# Patient Record
Sex: Female | Born: 1975
Health system: Southern US, Community
[De-identification: ages and names within clinical notes are randomized; demographics above are authoritative.]

## PROBLEM LIST (undated history)

## (undated) DIAGNOSIS — F418 Other specified anxiety disorders: Principal | ICD-10-CM

## (undated) DIAGNOSIS — E669 Obesity, unspecified: Secondary | ICD-10-CM

## (undated) DIAGNOSIS — F17210 Nicotine dependence, cigarettes, uncomplicated: Secondary | ICD-10-CM

## (undated) DIAGNOSIS — K644 Residual hemorrhoidal skin tags: Secondary | ICD-10-CM

## (undated) DIAGNOSIS — R87619 Unspecified abnormal cytological findings in specimens from cervix uteri: Secondary | ICD-10-CM

## (undated) HISTORY — DX: Residual hemorrhoidal skin tags: K64.4

## (undated) HISTORY — DX: Nicotine dependence, cigarettes, uncomplicated: F17.210

## (undated) HISTORY — DX: Other specified anxiety disorders: F41.8

## (undated) HISTORY — DX: Unspecified abnormal cytological findings in specimens from cervix uteri: R87.619

## (undated) HISTORY — DX: Obesity, unspecified: E66.9

---

## 2003-07-20 DIAGNOSIS — R87619 Unspecified abnormal cytological findings in specimens from cervix uteri: Secondary | ICD-10-CM

## 2003-07-20 HISTORY — PX: CERVICAL BIOPSY  W/ LOOP ELECTRODE EXCISION: SUR135

## 2003-07-20 HISTORY — DX: Unspecified abnormal cytological findings in specimens from cervix uteri: R87.619

## 2014-12-19 ENCOUNTER — Ambulatory Visit (INDEPENDENT_AMBULATORY_CARE_PROVIDER_SITE_OTHER): Payer: Commercial Managed Care - PPO | Admitting: Obstetrics and Gynecology

## 2014-12-19 ENCOUNTER — Encounter: Payer: Self-pay | Admitting: Obstetrics and Gynecology

## 2014-12-19 VITALS — BP 120/78 | HR 64 | Resp 16 | Ht 63.5 in | Wt 173.0 lb

## 2014-12-19 DIAGNOSIS — Z113 Encounter for screening for infections with a predominantly sexual mode of transmission: Secondary | ICD-10-CM

## 2014-12-19 DIAGNOSIS — Z01419 Encounter for gynecological examination (general) (routine) without abnormal findings: Secondary | ICD-10-CM

## 2014-12-19 DIAGNOSIS — N6452 Nipple discharge: Secondary | ICD-10-CM

## 2014-12-19 NOTE — Progress Notes (Signed)
Patient ID: Lauren Branch, female   DOB: October 18, 1975, 39 y.o.   MRN: [id number] 39 y.o.  G2 on file. Legally Separated Caucasian female here for annual exam.  "Goes by Lauren Branch"  Has a Mirena  IUD.  Placed 09/29/11.  Due for removal on 09/28/16. Cycles are irregular.  Bleeds a total of 3 - 4 days per month. Not able to feel strings.   Has bilateral nipple discharge with expression for a long time.  Describes it as creamy, white and mucousy and not really liquidy. On Citalopram.  Occasional vaginal discharge.  No itching or odor.   Works in Audiological scientist at [employer]. Has 2  girls - 3 and 38 years old.   Smoker - 1/2 pack per day.  Not ready to quit.   Will do blood work at work with the nurse.  PCP:   No PCP  Patient's last menstrual period was 12/09/2014.          Sexually active: Yes.    The current method of family planning is IUD good till 09-28-16.    Exercising: No.   Smoker:  yes  Health Maintenance: Pap:  2014 in [place], [place]  History of abnormal Pap:  Yes 2005 had a  LEEP- everything was neg per patient in [place], [place].  This was for high grade disease. Clinic has now closed where the LEEP was done.  MMG:  Never Colonoscopy: Never BMD:  Never TDaP:  With in 10 yrs per patient Screening Labs:  Employer does fasting labs reports that she has been smoking Cigarettes.  She has a 7.5 pack-year smoking history. She has never used smokeless tobacco. She reports that she drinks about 1.2 oz of alcohol per week. She reports that she does not use illicit drugs.  Past Medical History  Diagnosis Date  . Cervical dysphagia 2005  . Anxiety     Past Surgical History  Procedure Laterality Date  . Cervical biopsy  w/ loop electrode excision      Current Outpatient Prescriptions  Medication Sig Dispense Refill  . citalopram (CELEXA) 40 MG tablet Take 40 mg by mouth daily.  5   No current facility-administered medications for this visit.    Family History  Problem Relation Age of  Onset  . Diabetes Father     ROS:  Pertinent items are noted in HPI.  Otherwise, a comprehensive ROS was negative.  Exam:   BP 120/78 mmHg  Pulse 64  Resp 16  Ht 5' 3.5" (1.613 m)  Wt 173 lb (78.472 kg)  BMI 30.16 kg/m2  LMP 12/09/2014    General appearance: alert, cooperative and appears stated age Head: Normocephalic, without obvious abnormality, atraumatic Neck: no adenopathy, supple, symmetrical, trachea midline and thyroid normal to inspection and palpation Lungs: clear to auscultation bilaterally Breasts: normal appearance, no masses or tenderness, Inspection negative, No nipple retraction or dimpling, No nipple discharge or bleeding, No axillary or supraclavicular adenopathy Heart: regular rate and rhythm Abdomen: soft, non-tender; bowel sounds normal; no masses,  no organomegaly Extremities: extremities normal, atraumatic, no cyanosis or edema Skin: Skin color, texture, turgor normal. No rashes or lesions Lymph nodes: Cervical, supraclavicular, and axillary nodes normal. No abnormal inguinal nodes palpated Neurologic: Grossly normal  Pelvic: External genitalia:  no lesions              Urethra:  normal appearing urethra with no masses, tenderness or lesions              Bartholins and Skenes:  normal                 Vagina: normal appearing vagina with normal color and discharge, no lesions              Cervix: no lesions and IUD strings seen.              Pap taken: Yes.   Bimanual Exam:  Uterus:  normal size, contour, position, consistency, mobility, non-tender              Adnexa: normal adnexa and no mass, fullness, tenderness              Rectovaginal: No..  Confirms.              Anus:  normal sphincter tone, no lesions  Chaperone was present for exam.  Assessment:   Well woman visit with normal exam. Mirena IUD patient.  Smoker.  Declines cessation.  History of LEEP for HGSIL. Bilateral nipple discharge.  Not reproducible on exam today.   Plan: Yearly  mammogram recommended after age 39.  Recommended self breast exam.  Pap and HR HPV as above. Discussed Calcium, Vitamin D, regular exercise program including cardiovascular and weight bearing exercise. Labs performed.  Yes.  .   See orders.  Will do STD testing today.  Check TSH and prolactin. Discussed condom use.  Refills given on medications.  No..   Follow up annually and prn.      After visit summary provided.

## 2014-12-19 NOTE — Patient Instructions (Signed)

## 2014-12-20 LAB — PROLACTIN: Prolactin: 8.5 ng/mL

## 2014-12-20 LAB — STD PANEL
HIV 1&2 Ab, 4th Generation: NONREACTIVE
Hepatitis B Surface Ag: NEGATIVE

## 2014-12-20 LAB — TSH: TSH: 1.779 u[IU]/mL (ref 0.350–4.500)

## 2014-12-20 LAB — HEPATITIS C ANTIBODY: HCV AB: NEGATIVE

## 2014-12-21 LAB — GC/CHLAMYDIA PROBE AMP, URINE
CHLAMYDIA, SWAB/URINE, PCR: NEGATIVE
GC PROBE AMP, URINE: NEGATIVE

## 2014-12-23 LAB — IPS PAP TEST WITH HPV

## 2015-12-24 ENCOUNTER — Encounter: Payer: Self-pay | Admitting: Nurse Practitioner

## 2015-12-24 ENCOUNTER — Ambulatory Visit (INDEPENDENT_AMBULATORY_CARE_PROVIDER_SITE_OTHER): Payer: Commercial Managed Care - PPO | Admitting: Nurse Practitioner

## 2015-12-24 VITALS — BP 120/74 | HR 70 | Resp 16 | Ht 64.25 in | Wt 188.0 lb

## 2015-12-24 DIAGNOSIS — R87613 High grade squamous intraepithelial lesion on cytologic smear of cervix (HGSIL): Secondary | ICD-10-CM | POA: Diagnosis not present

## 2015-12-24 DIAGNOSIS — Z01419 Encounter for gynecological examination (general) (routine) without abnormal findings: Secondary | ICD-10-CM | POA: Diagnosis not present

## 2015-12-24 NOTE — Progress Notes (Signed)
40 y.o. [id number] Legally Separated  Caucasian Fe here for annual exam.  Separated from husband X 2.5 yrs.  divorce is pending.  Not SA/ dating since.  having menses every other mont light for 2-3 days.  Usually 1 day of moderate spotting. Some low back pain, no PMS.  Prior to Mirena IUD was having heavy menses and lasting 5 days, lot of cramps.  Patient's last menstrual period was 12/16/2015.          Sexually active: No.  The current method of family planning is Mirena IUD insertion 09/29/2011 and to be replaced 09/2016 Exercising: Yes.    Walking  3 x weekly Smoker:  yes  Health Maintenance: Pap:  12/19/14 Neg. HR HPV:neg MMG:  Never TDaP:  Current  Hep C and HIV: 12/2014 Neg Labs: at work   reports that she has been smoking Cigarettes.  She has a 7.5 pack-year smoking history. She has never used smokeless tobacco. She reports that she drinks about 1.2 oz of alcohol per week. She reports that she does not use illicit drugs.  Past Medical History  Diagnosis Date  . Anxiety   . Abnormal Pap smear of cervix 2005    cervical dysplasia/LEEP/[place], [place]    Past Surgical History  Procedure Laterality Date  . Cervical biopsy  w/ loop electrode excision  2005    [place]    Current Outpatient Prescriptions  Medication Sig Dispense Refill  . citalopram (CELEXA) 40 MG tablet Take 40 mg by mouth daily.  5  . levonorgestrel (MIRENA) 20 MCG/24HR IUD 1 each by Intrauterine route once. Due for removal 09/28/16.     No current facility-administered medications for this visit.    Family History  Problem Relation Age of Onset  . Diabetes Father 5  . Heart defect Brother     at birth and had open heart surgery < 1 yr.  . Alzheimer's disease Maternal Grandfather     died of surgical complication  . Dementia Maternal Grandmother   . Osteoarthritis Maternal Aunt     ROS:  Pertinent items are noted in HPI.  Otherwise, a comprehensive ROS was negative.  Exam:   BP 120/74 mmHg   Pulse 70  Resp 16  Ht 5' 4.25" (1.632 m)  Wt 188 lb (85.276 kg)  BMI 32.02 kg/m2  LMP 12/16/2015 Height: 5' 4.25" (163.2 cm) Ht Readings from Last 3 Encounters:  12/24/15 5' 4.25" (1.632 m)  12/19/14 5' 3.5" (1.613 m)    General appearance: alert, cooperative and appears stated age Head: Normocephalic, without obvious abnormality, atraumatic Neck: no adenopathy, supple, symmetrical, trachea midline and thyroid normal to inspection and palpation Lungs: clear to auscultation bilaterally Breasts: normal appearance, no masses or tenderness Heart: regular rate and rhythm Abdomen: soft, non-tender; no masses,  no organomegaly Extremities: extremities normal, atraumatic, no cyanosis or edema Skin: Skin color, texture, turgor normal. No rashes or lesions Lymph nodes: Cervical, supraclavicular, and axillary nodes normal. No abnormal inguinal nodes palpated Neurologic: Grossly normal   Pelvic: External genitalia:  no lesions              Urethra:  normal appearing urethra with no masses, tenderness or lesions              Bartholin's and Skene's: normal                 Vagina: normal appearing vagina with normal color and discharge, no lesions  Cervix: anteverted IUD strings are visible              Pap taken: No. Bimanual Exam:  Uterus:  normal size, contour, position, consistency, mobility, non-tender              Adnexa: no mass, fullness, tenderness               Rectovaginal: Confirms               Anus:  normal sphincter tone, no lesions  Chaperone present: no  A:  Well Woman with normal exam  Contraception - Mirena IUD placed 09/29/2011   Smoker. Declines cessation. currently at 1/2 - 3/4 pack a day  History of LEEP for HGSIL. 2005 - normal since    P:   Reviewed health and wellness pertinent to exam  Pap smear as above  Mammogram - may get screening this year as baseline  Counseled on breast self exam, adequate intake of calcium and vitamin D, diet and  exercise return annually or prn  An After Visit Summary was printed and given to the patient.

## 2015-12-24 NOTE — Patient Instructions (Signed)

## 2015-12-27 NOTE — Progress Notes (Signed)
Encounter reviewed Myron Stankovich, MD   

## 2016-10-25 ENCOUNTER — Telehealth: Payer: Self-pay | Admitting: Nurse Practitioner

## 2016-10-25 NOTE — Telephone Encounter (Signed)
Patient calling to have mirena removed and replaced.

## 2016-10-25 NOTE — Telephone Encounter (Signed)
I agree with this plan.  Have her call the office back with the UPT result.  We will do a UPT in the office also on the day of the exchange.  Orders will need to go to precert if not already done.   Thanks.

## 2016-10-25 NOTE — Telephone Encounter (Signed)
Spoke with patient. Patient's Mirena was placed on 09/29/2011 and was due for removal on 3/13/208. Patient is calling to have this removed and replaced. Patient has not been having regular cycles since having IUD. Has recently been sexually active without BUM. Advised she will need to take a UPT as her current IUD has expired. Advised will review with Dr.Silva IUD removal and reinsertion recommendations and return call. Patient is agreeable.  Dr.Silva, okay for patient to take UPT if negative go 2 weeks without intercourse and come in for IUD removal and reinsertion?

## 2016-10-26 NOTE — Telephone Encounter (Signed)
Left message to call Kaitlyn at [phone]. 

## 2016-10-26 NOTE — Telephone Encounter (Signed)
Spoke with patient. Advised of message as seen below from Dr.Silva. Patient is agreeable and verbalizes understanding. Will take UPT and return call with results. If negative can proceed with IUD removal and replacement after 2 weeks of abstinence.  Routing to provider for final review. Patient agreeable to disposition. Will close encounter.

## 2016-11-18 ENCOUNTER — Telehealth: Payer: Self-pay | Admitting: Nurse Practitioner

## 2016-11-18 ENCOUNTER — Other Ambulatory Visit: Payer: Self-pay | Admitting: *Deleted

## 2016-11-18 DIAGNOSIS — Z30433 Encounter for removal and reinsertion of intrauterine contraceptive device: Secondary | ICD-10-CM

## 2016-11-18 NOTE — Telephone Encounter (Signed)
Spoke with patient. Patient would like to scheduled IUD removal and reinsertion at [facility] with Ria CommentPatricia Grubb, NP. Mirena IUD placed 09/29/11. Advised patient they will need to be 2 different appointments and physician would be removing and reinsertion of IUD. Patient reports last having intercourse on 10/30/16, LMP "after that weekend, cycle is light, unsure of exact date". Patient scheduled for IUD removal, reinsertion on 11/22/16 at 3pm with Dr. Edward JollySilva. Advised patient since IUD expired will need to take UPT and report results, abstain from intercourse until after IUD exchange. Advised to take Motrin 800 mg with food and water one hour before procedure. Patient asking what cost will be, advised will have insurance and benefits department return call to review benefits. Advised patient would review with Dr. Edward JollySilva and return call with any additional recommendations, patient verbalizes understanding and is agreeable.   Dr. Edward JollySilva -any additional recommendations?   Cc: Lauren DingwallSuzy Branch, Ria CommentPatricia Grubb, NP

## 2016-11-18 NOTE — Telephone Encounter (Signed)
Patient would like to have her iud removed and inserted at her [facility] appointment. Patient is scheduled with Patty in June but is aware that [facility] does not insert or remove iuds.

## 2016-11-18 NOTE — Telephone Encounter (Signed)
I agree with your recommendations.  I am happy to do the exchange of the IUD.

## 2016-11-22 ENCOUNTER — Ambulatory Visit: Payer: Self-pay | Admitting: Obstetrics and Gynecology

## 2016-11-22 ENCOUNTER — Telehealth: Payer: Self-pay | Admitting: Obstetrics and Gynecology

## 2016-11-22 NOTE — Progress Notes (Deleted)
GYNECOLOGY  VISIT   HPI: 41 y.o.   Legally Separated  Caucasian  female   [phone] with No LMP recorded.   here for Mirena IUD removal and reinsertion.    GYNECOLOGIC HISTORY: No LMP recorded. Contraception:  Mirena IUD inserted 09-19-11--EXPIRED Menopausal hormone therapy:  n/a Last mammogram:  n/a Last pap smear:   12-19-14 Neg:Neg HR HPV; [place]--[place] for high grade disease per patient.        OB History    Gravida Para Term Preterm AB Living   2 2 2  0 0 2   SAB TAB Ectopic Multiple Live Births   0 0 0 0           There are no active problems to display for this patient.   Past Medical History:  Diagnosis Date  . Abnormal Pap smear of cervix 2005   cervical dysplasia/LEEP/[place], [place]  . Anxiety     Past Surgical History:  Procedure Laterality Date  . CERVICAL BIOPSY  W/ LOOP ELECTRODE EXCISION  2005   [place]    Current Outpatient Prescriptions  Medication Sig Dispense Refill  . citalopram (CELEXA) 40 MG tablet Take 40 mg by mouth daily.  5  . levonorgestrel (MIRENA) 20 MCG/24HR IUD 1 each by Intrauterine route once. Due for removal 09/28/16.     No current facility-administered medications for this visit.      ALLERGIES: Patient has no known allergies.  Family History  Problem Relation Age of Onset  . Diabetes Father 5  . Heart defect Brother     at birth and had open heart surgery < 1 yr.  . Alzheimer's disease Maternal Grandfather     died of surgical complication  . Dementia Maternal Grandmother   . Osteoarthritis Maternal Aunt     Social History   Social History  . Marital status: Legally Separated    Spouse name: N/A  . Number of children: N/A  . Years of education: N/A   Occupational History  . Not on file.   Social History Main Topics  . Smoking status: Current Every Day Smoker    Packs/day: 0.50    Years: 15.00    Types: Cigarettes  . Smokeless tobacco: Never Used  . Alcohol  use 1.2 oz/week    2 Standard drinks or equivalent per week  . Drug use: No  . Sexual activity: Yes    Partners: Male    Birth control/ protection: IUD     Comment: Mirena  removal date 09-28-16   Other Topics Concern  . Not on file   Social History Narrative  . No narrative on file    ROS:  Pertinent items are noted in HPI.  PHYSICAL EXAMINATION:    There were no vitals taken for this visit.    General appearance: alert, cooperative and appears stated age Head: Normocephalic, without obvious abnormality, atraumatic Neck: no adenopathy, supple, symmetrical, trachea midline and thyroid normal to inspection and palpation Lungs: clear to auscultation bilaterally Breasts: normal appearance, no masses or tenderness, No nipple retraction or dimpling, No nipple discharge or bleeding, No axillary or supraclavicular adenopathy Heart: regular rate and rhythm Abdomen: soft, non-tender, no masses,  no organomegaly Extremities: extremities normal, atraumatic, no cyanosis or edema Skin: Skin color, texture, turgor normal. No rashes or lesions Lymph nodes: Cervical, supraclavicular, and axillary nodes normal. No abnormal inguinal nodes palpated Neurologic: Grossly normal  Pelvic: External genitalia:  no  lesions              Urethra:  normal appearing urethra with no masses, tenderness or lesions              Bartholins and Skenes: normal                 Vagina: normal appearing vagina with normal color and discharge, no lesions              Cervix: no lesions                Bimanual Exam:  Uterus:  normal size, contour, position, consistency, mobility, non-tender              Adnexa: no mass, fullness, tenderness              Rectal exam: {yes no:[id number]}.  Confirms.              Anus:  normal sphincter tone, no lesions  Chaperone was present for exam.  ASSESSMENT     PLAN     An After Visit Summary was printed and given to the patient.  ______ minutes face to face time of  which over 50% was spent in counseling.

## 2016-11-22 NOTE — Telephone Encounter (Signed)
Spoke with patient. Mirena removal and reinsertion rescheduled for 11/26/2016 at 3 pm with Dr.Silva. Patient is agreeable to date and time. Pre procedure instructions given.  Motrin instructions given. Motrin=Advil=Ibuprofen, 800 mg one hour before appointment. Eat a meal and hydrate well before appointment. Patient has taken UPT which was negative as her IUD has expired. Patient is aware she will need to remain abstinent until exchange of IUD and that UPT will be performed at the office.  Routing to provider for final review. Patient agreeable to disposition. Will close encounter.

## 2016-11-22 NOTE — Telephone Encounter (Signed)
Patient called to cancel appointment for today for IUD removal and reinsertion. Her daughter broke her ankle this weekend and she needs to take her to the orthopedic surgeon today. Patient would like to reschedule for sometime this week if possible.

## 2016-11-25 NOTE — Progress Notes (Signed)
GYNECOLOGY  VISIT   HPI: 41 y.o.   Legally Separated  Caucasian  female   [phone] [id number] with No LMP recorded (lmp unknown).   here for Mirena IUD removal and reinsertion.  Patient took 400mg  of Ibuprofen 1 hour ago.   Abstained from intercourse for one month. Current IUD is expired.   Spotting a little today.   Has a new partner for a couple of months.   UPT negative today.  GYNECOLOGIC HISTORY: No LMP recorded (lmp unknown). Contraception:  Mirena IUD inserted 09-29-11(Expired) Menopausal hormone therapy:  n/a Last mammogram:  n/a Last pap smear: 12-19-14 Neg:Neg HR HPV; [place]        OB History    Gravida Para Term Preterm AB Living   2 2 2  0 0 2   SAB TAB Ectopic Multiple Live Births   0 0 0 0           There are no active problems to display for this patient.   Past Medical History:  Diagnosis Date  . Abnormal Pap smear of cervix 2005   cervical dysplasia/LEEP/[place], [place]  . Anxiety     Past Surgical History:  Procedure Laterality Date  . CERVICAL BIOPSY  W/ LOOP ELECTRODE EXCISION  2005   [place]    Current Outpatient Prescriptions  Medication Sig Dispense Refill  . citalopram (CELEXA) 40 MG tablet Take 40 mg by mouth daily.  5  . doxycycline (VIBRAMYCIN) 100 MG capsule TAKE ONE CAPSULE BY MOUTH TWICE A DAY UNTIL FINISHED  0  . levonorgestrel (MIRENA) 20 MCG/24HR IUD 1 each by Intrauterine route once. Due for removal 09/28/16.    Marland Kitchen. valACYclovir (VALTREX) 1000 MG tablet TAKE 2 TABLETS BY MOUTH TWICE A DAY FOR 1 DAY AT ONSET OF COLD SORE  3   No current facility-administered medications for this visit.      ALLERGIES: Patient has no known allergies.  Family History  Problem Relation Age of Onset  . Diabetes Father 5  . Heart defect Brother        at birth and had open heart surgery < 1 yr.  . Alzheimer's disease Maternal Grandfather        died of surgical complication  . Dementia Maternal Grandmother   . Osteoarthritis  Maternal Aunt     Social History   Social History  . Marital status: Legally Separated    Spouse name: N/A  . Number of children: N/A  . Years of education: N/A   Occupational History  . Not on file.   Social History Main Topics  . Smoking status: Current Every Day Smoker    Packs/day: 0.50    Years: 15.00    Types: Cigarettes  . Smokeless tobacco: Never Used  . Alcohol use 1.2 oz/week    2 Standard drinks or equivalent per week  . Drug use: No  . Sexual activity: Yes    Partners: Male    Birth control/ protection: IUD     Comment: Mirena  removal date 09-28-16   Other Topics Concern  . Not on file   Social History Narrative  . No narrative on file    ROS:  Pertinent items are noted in HPI.  PHYSICAL EXAMINATION:    BP 118/70 (BP Location: Right Arm, Patient Position: Sitting, Cuff Size: Normal)   Pulse 84   Resp 16   Wt 177 lb (80.3 kg)   LMP  (LMP Unknown) Comment: patient has had some  spotting  BMI 30.15 kg/m     General appearance: alert, cooperative and appears stated age   Pelvic: External genitalia:  no lesions              Urethra:  normal appearing urethra with no masses, tenderness or lesions              Bartholins and Skenes: normal                 Vagina: normal appearing vagina with normal color and discharge, no lesions              Cervix: no lesions.  IUD strings noted.                Bimanual Exam:  Uterus:  normal size, contour, position, consistency, mobility, non-tender              Adnexa: no mass, fullness, tenderness        IUD removal and reinsertion of Mirena IUD.  Consent for procedures.  Mirena IUD - Lot number [id number], exp Sept, 2010.  Sterile prep with Hibiclens.  Tenaculum to anterior cervical lip. Uterus sounded to 7 cm.  IUD placed without difficulty.  Strings trimmed.  No complications.  Minimal EBL. Repeat BM exam, no change.  Chaperone was present for exam.  ASSESSMENT  Mirena IUD removal and replacement of  new Mirena IUD.  STD screening.   PLAN  GC/CT performed today.  I did this after I had already done my initial bimanual exam prior to placing the IUD.  I did try to get inside the canal adequately to make up for this. Back up protection for 2 weeks.  Follow up for annual exam and can have IUD check then. Will need full serum STD check then.   An After Visit Summary was printed and given to the patient.  __15___ minutes face to face time of which over 50% was spent in counseling.

## 2016-11-26 ENCOUNTER — Ambulatory Visit (INDEPENDENT_AMBULATORY_CARE_PROVIDER_SITE_OTHER): Payer: Commercial Managed Care - PPO | Admitting: Obstetrics and Gynecology

## 2016-11-26 ENCOUNTER — Encounter: Payer: Self-pay | Admitting: Obstetrics and Gynecology

## 2016-11-26 VITALS — BP 118/70 | HR 84 | Resp 16 | Wt 177.0 lb

## 2016-11-26 DIAGNOSIS — Z30433 Encounter for removal and reinsertion of intrauterine contraceptive device: Secondary | ICD-10-CM | POA: Diagnosis not present

## 2016-11-26 DIAGNOSIS — Z113 Encounter for screening for infections with a predominantly sexual mode of transmission: Secondary | ICD-10-CM

## 2016-11-26 HISTORY — PX: INTRAUTERINE DEVICE INSERTION: SHX323

## 2016-11-26 LAB — POCT URINE PREGNANCY: Preg Test, Ur: NEGATIVE

## 2016-11-26 NOTE — Patient Instructions (Signed)

## 2016-11-27 LAB — GC/CHLAMYDIA PROBE AMP
CT Probe RNA: NOT DETECTED
GC PROBE AMP APTIMA: NOT DETECTED

## 2016-12-29 ENCOUNTER — Other Ambulatory Visit (HOSPITAL_COMMUNITY)
Admission: RE | Admit: 2016-12-29 | Discharge: 2016-12-29 | Disposition: A | Discharge status: Home / Self Care | Payer: Commercial Managed Care - PPO | Source: Ambulatory Visit | Attending: Nurse Practitioner | Admitting: Nurse Practitioner

## 2016-12-29 ENCOUNTER — Encounter: Payer: Self-pay | Admitting: Nurse Practitioner

## 2016-12-29 ENCOUNTER — Ambulatory Visit (INDEPENDENT_AMBULATORY_CARE_PROVIDER_SITE_OTHER): Payer: Commercial Managed Care - PPO | Admitting: Nurse Practitioner

## 2016-12-29 ENCOUNTER — Telehealth: Payer: Self-pay | Admitting: Nurse Practitioner

## 2016-12-29 VITALS — BP 110/74 | HR 60 | Ht 64.0 in | Wt 179.0 lb

## 2016-12-29 DIAGNOSIS — Z113 Encounter for screening for infections with a predominantly sexual mode of transmission: Secondary | ICD-10-CM | POA: Insufficient documentation

## 2016-12-29 DIAGNOSIS — R87613 High grade squamous intraepithelial lesion on cytologic smear of cervix (HGSIL): Secondary | ICD-10-CM | POA: Insufficient documentation

## 2016-12-29 DIAGNOSIS — Z01419 Encounter for gynecological examination (general) (routine) without abnormal findings: Secondary | ICD-10-CM | POA: Diagnosis not present

## 2016-12-29 NOTE — Progress Notes (Signed)
Patient ID: Lauren NettleRebekah Hillyard, female   DOB: [birth year], 41 y.o.   MRN: [id number]  41 y.o. G2P2002 Legally Separated Caucasian Fe here for annual exam.  she hs a new Mirena IUD inserted on 11/26/16.  She also needs that recheck today. Menses is still only spotting with wiping X 2 days.  Prior to previous Mirena IUD menses was very heavy X 5 days with dysmenorrhea. New partner for a few months. [place] in [place] so she has not seen him or been SA since new IUD.   Daughter's are now 2010 & 40 yrs old.  Mother lives with her to help with the kids.  Patient's last menstrual period was 12/22/2016 (exact date).          Sexually active: Yes.    The current method of family planning is IUD. Mirena inserted on 11/26/16. Exercising: Yes.    participates in various outside activities to stay active. Smoker:  Yes, about 1/2 pack per day  Health Maintenance: Pap: 12/19/14, Negative with neg HR HPV  2014, Negative per patient Lissa Hoard([place], [place]) History of Abnormal Pap: yes, LEEP for cervical dysplasia in 2005 Self Breast exams: yes TDaP: within 10 years HIV: 12/19/14 Labs: work, patient brought copies with her   reports that she has been smoking Cigarettes.  She has a 7.50 pack-year smoking history. She has never used smokeless tobacco. She reports that she drinks about 1.2 oz of alcohol per week . She reports that she does not use drugs.  Past Medical History:  Diagnosis Date  . Abnormal Pap smear of cervix 2005   cervical dysplasia/LEEP/[place], [place]  . Anxiety     Past Surgical History:  Procedure Laterality Date  . CERVICAL BIOPSY  W/ LOOP ELECTRODE EXCISION  2005   Cer[place]agia [place]  . INTRAUTERINE DEVICE INSERTION  11/26/2016   Mirena 11/2011 and 11/2016    Current Outpatient Prescriptions  Medication Sig Dispense Refill  . Cholecalciferol (VITAMIN D3) 2000 units TABS Take 1-2 tablets by mouth daily.    . citalopram (CELEXA) 40 MG tablet Take 40 mg by mouth daily.  5  . levonorgestrel (MIRENA)  20 MCG/24HR IUD 1 each by Intrauterine route once. Due for removal 09/28/16.    Marland Kitchen. valACYclovir (VALTREX) 1000 MG tablet TAKE 2 TABLETS BY MOUTH TWICE A DAY FOR 1 DAY AT ONSET OF COLD SORE  3   No current facility-administered medications for this visit.     Family History  Problem Relation Age of Onset  . Diabetes Father 5  . Heart defect Brother        at birth and had open heart surgery < 1 yr.  . Alzheimer's disease Maternal Grandfather        died of surgical complication  . Dementia Maternal Grandmother   . Osteoarthritis Maternal Aunt     ROS:  Pertinent items are noted in HPI.  Otherwise, a comprehensive ROS was negative.  Exam:   BP 110/74 (BP Location: Right Arm, Patient Position: Sitting, Cuff Size: Large)   Pulse 60   Ht 5\' 4"  (1.626 m)   Wt 179 lb (81.2 kg)   LMP 12/22/2016 (Exact Date) Comment: light spotting with IUD  BMI 30.73 kg/m  Height: 5\' 4"  (162.6 cm) Ht Readings from Last 3 Encounters:  12/29/16 5\' 4"  (1.626 m)  12/24/15 5' 4.25" (1.632 m)  12/19/14 5' 3.5" (1.613 m)    General appearance: alert, cooperative and appears stated age Head: Normocephalic, without obvious abnormality, atraumatic Neck:  no adenopathy, supple, symmetrical, trachea midline and thyroid normal to inspection and palpation Lungs: clear to auscultation bilaterally Breasts: normal appearance, no masses or tenderness Heart: regular rate and rhythm Abdomen: soft, non-tender; no masses,  no organomegaly Extremities: extremities normal, atraumatic, no cyanosis or edema Skin: Skin color, texture, turgor normal. No rashes or lesions Lymph nodes: Cervical, supraclavicular, and axillary nodes normal. No abnormal inguinal nodes palpated Neurologic: Grossly normal   Pelvic: External genitalia:  no lesions              Urethra:  normal appearing urethra with no masses, tenderness or lesions              Bartholin's and Skene's: normal                 Vagina: normal appearing vagina with  normal color and discharge, no lesions              Cervix: anteverted  IUD string is visualized              Pap taken: Yes.   Bimanual Exam:  Uterus:  normal size, contour, position, consistency, mobility, non-tender              Adnexa: no mass, fullness, tenderness               Rectovaginal: Confirms               Anus:  normal sphincter tone, no lesions  Chaperone present: yes  A:  Well Woman with normal exam  Contraception with Mirena IUD - new insertion 11/26/16  History of  HGSIL - 2005 with LEEP - normal since  Recent negative GC & Chl - will get rest of STD's today  P:   Reviewed health and wellness pertinent to exam  Pap smear: yes  Mammogram is due at age 54  Pt did not go to the lab - but she is called and will return for STD lab - order is placed for future labs  Counseled on breast self exam, mammography screening, adequate intake of calcium and vitamin D, diet and exercise return annually or prn  An After Visit Summary was printed and given to the patient.

## 2016-12-29 NOTE — Patient Instructions (Signed)

## 2016-12-29 NOTE — Telephone Encounter (Signed)
Pt was called back and confirmed that she did not get her labs done today.  An order is placed for future STD labs and she will call tomorrow to schedule.

## 2016-12-31 NOTE — Progress Notes (Signed)
Encounter reviewed Jill Jertson, MD   

## 2017-01-04 LAB — CYTOLOGY - PAP
DIAGNOSIS: NEGATIVE
HPV (WINDOPATH): DETECTED — AB
HPV 16/18/45 GENOTYPING: NEGATIVE

## 2017-01-20 ENCOUNTER — Telehealth: Payer: Self-pay

## 2017-01-20 NOTE — Telephone Encounter (Signed)
-----   Message from Luisa DagoStephanie C Phillips, [place] sent at 01/11/2017  4:24 PM EDT -----   ----- Message ----- From: Ria CommentGrubb, Patricia, FNP Sent: 01/11/2017   1:07 PM To: Luisa DagoStephanie C Phillips, CMA  Pap 08.  Pap was negative but + HR HPV and negative for 16, 18, & 45.  Per guidelines repeat pap in 1 yrs.  Let pt know about results.

## 2017-01-20 NOTE — Telephone Encounter (Signed)
Left message to call Lauren Branch at [phone]. 

## 2017-01-26 NOTE — Telephone Encounter (Addendum)
Attempted to reach patient at [phone], there was no answer and recording states voicemail box is full.

## 2017-02-08 NOTE — Telephone Encounter (Signed)
Spoke with patient. Results given. 08 recall placed.

## 2017-02-08 NOTE — Telephone Encounter (Signed)
Left message to call Jaiyden Laur at [phone]. 

## 2017-02-22 ENCOUNTER — Telehealth: Payer: Self-pay | Admitting: *Deleted

## 2017-02-22 NOTE — Telephone Encounter (Signed)
Message left on voicemail to follow up on requested labs at recent annual exam. Requested return call from patient.

## 2017-02-22 NOTE — Telephone Encounter (Signed)
-----   Message from Verner Choleborah S Leonard, CNM sent at 02/07/2017  9:36 AM EDT ----- Regarding: Serum STD screening Remind patient has not been done, if she does not desire, we need to cancel order

## 2017-03-18 ENCOUNTER — Ambulatory Visit (INDEPENDENT_AMBULATORY_CARE_PROVIDER_SITE_OTHER): Payer: Commercial Managed Care - PPO | Admitting: Family Medicine

## 2017-03-18 ENCOUNTER — Encounter: Payer: Self-pay | Admitting: Family Medicine

## 2017-03-18 VITALS — BP 122/78 | HR 68 | Temp 97.9°F | Resp 16 | Ht 64.0 in | Wt 177.4 lb

## 2017-03-18 DIAGNOSIS — K644 Residual hemorrhoidal skin tags: Secondary | ICD-10-CM | POA: Diagnosis not present

## 2017-03-18 MED ORDER — HYDROCORTISONE 2.5 % RE CREA: 1.0000 "application " | TOPICAL_CREAM | Freq: Two times a day (BID) | RECTAL | 0 refills | Status: DC

## 2017-03-18 NOTE — Progress Notes (Signed)
Subjective:    Patient ID: Lauren Branch, female    DOB: 1975/08/01, 41 y.o.   MRN: [id number]  HPI This is a 41 yo female who presents today with complaint of hemorrhoids. Started 41 yo when she was pregnant with her oldest child. Had rare flare ups. Over last 3 weeks, she has been having more rectal pain. Is able to feel protrusion. Some improvement with ibuprofen, 600 mg occasionally. Bowel movements have been loose. She has been under a lot of stress. Has daily bowel movements, no straining. Once had blood in toilet bowl. Occasional bleeding with wiping, none recently. No dizziness, no fatigue, no weight loss. Has used some over the counter cream.   She plans to establish care with Dr. Earlene PlaterWallace at a later time. Sees gyn regularly.   Has had increased stress with recent promotion at work. Takes alprazolam rarely, takes citalopram with good results. She is tearful today and states that she is just very tired from a long work week. States that she feels that anxiety is generally well controlled.   Past Medical History:  Diagnosis Date  . Abnormal Pap smear of cervix 2005   cervical dysplasia/LEEP/[place], [place]  . Anxiety    Past Surgical History:  Procedure Laterality Date  . CERVICAL BIOPSY  W/ LOOP ELECTRODE EXCISION  2005   [place]ag[place]  . INTRAUTERINE DEVICE INSERTION  11/26/2016   Mirena 11/2011 and 11/2016   Family History  Problem Relation Age of Onset  . Diabetes Father 5  . Heart defect Brother        at birth and had open heart surgery < 1 yr.  . Alzheimer's disease Maternal Grandfather        died of surgical complication  . Dementia Maternal Grandmother   . Osteoarthritis Maternal Aunt    Social History  Substance Use Topics  . Smoking status: Current Every Day Smoker    Packs/day: 0.50    Years: 15.00    Types: Cigarettes  . Smokeless tobacco: Never Used  . Alcohol use 1.2 oz/week    2 Standard drinks or equivalent per week     Comment: socially        Review of Systems Per HPI    Objective:   Physical Exam  Constitutional: She is oriented to person, place, and time. She appears well-developed and well-nourished. No distress.  Cardiovascular: Normal rate.   Pulmonary/Chest: Effort normal.  Genitourinary:  Genitourinary Comments: Rectum with multiple, soft, non erythematous hemorrhoidal skin tags.   Neurological: She is alert and oriented to person, place, and time.  Skin: Skin is warm and dry. She is not diaphoretic.  Psychiatric: Her behavior is normal. Judgment and thought content normal.  Occasionally tearful.   Vitals reviewed.   BP 122/78 (BP Location: Left Arm, Patient Position: Sitting, Cuff Size: Normal)   Pulse 68   Temp 97.9 F (36.6 C) (Oral)   Resp 16   Ht 5\' 4"  (1.626 m)   Wt 177 lb 6.4 oz (80.5 kg)   SpO2 98%   BMI 30.45 kg/m  Wt Readings from Last 3 Encounters:  03/18/17 177 lb 6.4 oz (80.5 kg)  12/29/16 179 lb (81.2 kg)  11/26/16 177 lb (80.3 kg)       Assessment & Plan:  1. External hemorrhoid - Provided written and verbal information regarding diagnosis and treatment. - hydrocortisone (ANUSOL-HC) 2.5 % rectal cream; Place 1 application rectally 2 (two) times daily.  Dispense: 30 g; Refill: 0 -  encouraged her to have good fluid intake, good fiber intake, discussed avoiding excessive wiping.   - follow up to establish with Dr. Olena Mater, FNP-BC  [facility], [facility]  03/18/2017 1:38 PM

## 2017-03-18 NOTE — Patient Instructions (Addendum)
Please schedule an appointment to establish care with Dr. Earlene PlaterWallace Try using a squeeze bottle with warm water and flushable wipes for cleaning Increase fiber to help with stool texture If not better in 5-7 days, please let us know  Hemorrhoids Hemorrhoids are swollen veins in and around the rectum or anus. There are two types of hemorrhoids:  Internal hemorrhoids. These occur in the veins that are just inside the rectum. They may poke through to the outside and become irritated and painful.  External hemorrhoids. These occur in the veins that are outside of the anus and can be felt as a painful swelling or hard lump near the anus.  Most hemorrhoids do not cause serious problems, and they can be managed with home treatments such as diet and lifestyle changes. If home treatments do not help your symptoms, procedures can be done to shrink or remove the hemorrhoids. What are the causes? This condition is caused by increased pressure in the anal area. This pressure may result from various things, including:  Constipation.  Straining to have a bowel movement.  Diarrhea.  Pregnancy.  Obesity.  Sitting for long periods of time.  Heavy lifting or other activity that causes you to strain.  Anal sex.  What are the signs or symptoms? Symptoms of this condition include:  Pain.  Anal itching or irritation.  Rectal bleeding.  Leakage of stool (feces).  Anal swelling.  One or more lumps around the anus.  How is this diagnosed? This condition can often be diagnosed through a visual exam. Other exams or tests may also be done, such as:  Examination of the rectal area with a gloved hand (digital rectal exam).  Examination of the anal canal using a small tube (anoscope).  A blood test, if you have lost a significant amount of blood.  A test to look inside the colon (sigmoidoscopy or colonoscopy).  How is this treated? This condition can usually be treated at home. However,  various procedures may be done if dietary changes, lifestyle changes, and other home treatments do not help your symptoms. These procedures can help make the hemorrhoids smaller or remove them completely. Some of these procedures involve surgery, and others do not. Common procedures include:  Rubber band ligation. Rubber bands are placed at the base of the hemorrhoids to cut off the blood supply to them.  Sclerotherapy. Medicine is injected into the hemorrhoids to shrink them.  Infrared coagulation. A type of light energy is used to get rid of the hemorrhoids.  Hemorrhoidectomy surgery. The hemorrhoids are surgically removed, and the veins that supply them are tied off.  Stapled hemorrhoidopexy surgery. A circular stapling device is used to remove the hemorrhoids and use staples to cut off the blood supply to them.  Follow these instructions at home: Eating and drinking  Eat foods that have a lot of fiber in them, such as whole grains, beans, nuts, fruits, and vegetables. Ask your health care provider about taking products that have added fiber (fiber supplements).  Drink enough fluid to keep your urine clear or pale yellow. Managing pain and swelling  Take warm sitz baths for 20 minutes, 3-4 times a day to ease pain and discomfort.  If directed, apply ice to the affected area. Using ice packs between sitz baths may be helpful. ? Put ice in a plastic bag. ? Place a towel between your skin and the bag. ? Leave the ice on for 20 minutes, 2-3 times a day. General instructions  Take  over-the-counter and prescription medicines only as told by your health care provider.  Use medicated creams or suppositories as told.  Exercise regularly.  Go to the bathroom when you have the urge to have a bowel movement. Do not wait.  Avoid straining to have bowel movements.  Keep the anal area dry and clean. Use wet toilet paper or moist towelettes after a bowel movement.  Do not sit on the toilet  for long periods of time. This increases blood pooling and pain. Contact a health care provider if:  You have increasing pain and swelling that are not controlled by treatment or medicine.  You have uncontrolled bleeding.  You have difficulty having a bowel movement, or you are unable to have a bowel movement.  You have pain or inflammation outside the area of the hemorrhoids. This information is not intended to replace advice given to you by your health care provider. Make sure you discuss any questions you have with your health care provider. Document Released: 07/02/2000 Document Revised: 12/03/2015 Document Reviewed: 03/19/2015 Elsevier Interactive Patient Education  2017 [employer].

## 2017-03-25 NOTE — Telephone Encounter (Signed)
OK to close

## 2017-03-25 NOTE — Telephone Encounter (Signed)
No Patient response. Ok to close encounter?    

## 2017-04-05 ENCOUNTER — Telehealth: Payer: Self-pay | Admitting: Family Medicine

## 2017-04-05 NOTE — Telephone Encounter (Signed)
ROI faxed to [facility]

## 2017-04-07 ENCOUNTER — Encounter: Payer: Self-pay | Admitting: Family Medicine

## 2017-04-07 ENCOUNTER — Ambulatory Visit (INDEPENDENT_AMBULATORY_CARE_PROVIDER_SITE_OTHER): Payer: Commercial Managed Care - PPO | Admitting: Family Medicine

## 2017-04-07 VITALS — BP 124/72 | HR 78 | Temp 98.4°F | Ht 64.0 in | Wt 176.2 lb

## 2017-04-07 DIAGNOSIS — F17210 Nicotine dependence, cigarettes, uncomplicated: Secondary | ICD-10-CM | POA: Diagnosis not present

## 2017-04-07 DIAGNOSIS — E669 Obesity, unspecified: Secondary | ICD-10-CM

## 2017-04-07 DIAGNOSIS — Z975 Presence of (intrauterine) contraceptive device: Secondary | ICD-10-CM | POA: Insufficient documentation

## 2017-04-07 DIAGNOSIS — F418 Other specified anxiety disorders: Secondary | ICD-10-CM

## 2017-04-07 DIAGNOSIS — K644 Residual hemorrhoidal skin tags: Secondary | ICD-10-CM

## 2017-04-07 DIAGNOSIS — F411 Generalized anxiety disorder: Secondary | ICD-10-CM | POA: Insufficient documentation

## 2017-04-07 HISTORY — DX: Residual hemorrhoidal skin tags: K64.4

## 2017-04-07 HISTORY — DX: Obesity, unspecified: E66.9

## 2017-04-07 HISTORY — DX: Other specified anxiety disorders: F41.8

## 2017-04-07 HISTORY — DX: Nicotine dependence, cigarettes, uncomplicated: F17.210

## 2017-04-07 MED ORDER — CLONAZEPAM 0.5 MG PO TABS: 0.5000 mg | ORAL_TABLET | Freq: Two times a day (BID) | ORAL | 1 refills | Status: DC | PRN
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
## 2017-05-04 DIAGNOSIS — K644 Residual hemorrhoidal skin tags: Secondary | ICD-10-CM | POA: Diagnosis not present

## 2017-05-04 DIAGNOSIS — K6289 Other specified diseases of anus and rectum: Secondary | ICD-10-CM | POA: Diagnosis not present

## 2017-06-13 NOTE — Telephone Encounter (Signed)
[facility]

## 2017-07-07 ENCOUNTER — Ambulatory Visit: Payer: Commercial Managed Care - PPO | Admitting: Family Medicine

## 2017-07-15 ENCOUNTER — Ambulatory Visit: Payer: Commercial Managed Care - PPO | Admitting: Family Medicine

## 2017-07-15 VITALS — BP 126/84 | HR 75 | Temp 98.0°F | Ht 64.0 in | Wt 178.0 lb

## 2017-07-15 DIAGNOSIS — F17210 Nicotine dependence, cigarettes, uncomplicated: Secondary | ICD-10-CM | POA: Diagnosis not present

## 2017-07-15 DIAGNOSIS — K644 Residual hemorrhoidal skin tags: Secondary | ICD-10-CM

## 2017-07-15 DIAGNOSIS — E669 Obesity, unspecified: Secondary | ICD-10-CM

## 2017-07-15 DIAGNOSIS — F418 Other specified anxiety disorders: Secondary | ICD-10-CM

## 2017-07-15 MED ORDER — CLONAZEPAM 0.5 MG PO TABS: 0.5000 mg | ORAL_TABLET | Freq: Two times a day (BID) | ORAL | 1 refills | Status: DC | PRN
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
## 2017-07-15 NOTE — Patient Instructions (Signed)
   [place] QUITLINE: 1-800-QUIT-NOW (1-800-784-8669)  

## 2017-12-30 ENCOUNTER — Ambulatory Visit: Payer: Commercial Managed Care - PPO | Admitting: Nurse Practitioner

## 2018-01-02 ENCOUNTER — Other Ambulatory Visit: Payer: Self-pay

## 2018-01-02 ENCOUNTER — Encounter: Payer: Self-pay | Admitting: Obstetrics and Gynecology

## 2018-01-02 ENCOUNTER — Other Ambulatory Visit (HOSPITAL_COMMUNITY)
Admission: RE | Admit: 2018-01-02 | Discharge: 2018-01-02 | Disposition: A | Discharge status: Home / Self Care | Payer: Commercial Managed Care - PPO | Source: Ambulatory Visit | Attending: Obstetrics and Gynecology | Admitting: Obstetrics and Gynecology

## 2018-01-02 ENCOUNTER — Ambulatory Visit (INDEPENDENT_AMBULATORY_CARE_PROVIDER_SITE_OTHER): Payer: Commercial Managed Care - PPO | Admitting: Obstetrics and Gynecology

## 2018-01-02 VITALS — BP 110/74 | HR 76 | Resp 16 | Ht 63.5 in | Wt 180.0 lb

## 2018-01-02 DIAGNOSIS — Z01419 Encounter for gynecological examination (general) (routine) without abnormal findings: Secondary | ICD-10-CM | POA: Insufficient documentation

## 2018-01-02 NOTE — Progress Notes (Signed)
42 y.o. [id number] Legally Separated Caucasian female here for annual exam.    Barely has her menses monthly.  Lasts a day.   Has hemorrhoids.  Saw her PCP and saw a Development worker, international aidgeneral surgeon.  Symptoms improved.   New work and feeling much better.   Does labs at work.  Everything normal per patient.   Declines STD testing.  No change in partner.   PCP: Helane RimaErica Wallace, DO    Patient's last menstrual period was 12/05/2017.           Sexually active: Yes.    The current method of family planning is Mirena inserted on 11/26/16.    Exercising: No.  The patient does not participate in regular exercise at present. Smoker:  yes  Health Maintenance: Pap:  12/29/16 Neg:Pos HR HPV; Neg 45,40,9816,18,45 History of abnormal Pap:  Yes, LEEP for cervical dysplasia in 2005 MMG:  never TDaP:  Up to date per patient Gardasil:   no HIV and Hep C: 12/19/14 Negative Screening Labs:  Done at work   reports that she has been smoking cigarettes.  She has a 7.50 pack-year smoking history. She has never used smokeless tobacco. She reports that she drinks about 1.2 oz of alcohol per week. She reports that she does not use drugs.  Past Medical History:  Diagnosis Date  . Abnormal Pap smear of cervix 2005   [place]  . Cigarette nicotine dependence without complication 04/07/2017  . Obesity (BMI 30-39.9) 04/07/2017  . Residual hemorrhoidal skin tags 04/07/2017  . Situational anxiety 04/07/2017    Past Surgical History:  Procedure Laterality Date  . CERVICAL BIOPSY  W/ LOOP ELECTRODE EXCISION  2005   [place]  . INTRAUTERINE DEVICE INSERTION  11/26/2016   Mirena 11/2011 and 11/2016    Current Outpatient Medications  Medication Sig Dispense Refill  . clonazePAM (KLONOPIN) 0.5 MG tablet Take 1 tablet (0.5 mg total) by mouth 2 (two) times daily as needed for anxiety. 90 tablet 1  . hydrocortisone (ANUSOL-HC) 2.5 % rectal cream Place 1 application rectally 2 (two) times  daily. 30 g 0  . levonorgestrel (MIRENA) 20 MCG/24HR IUD 1 each by Intrauterine route once. Due for removal 09/28/16.    Marland Kitchen. valACYclovir (VALTREX) 1000 MG tablet TAKE 2 TABLETS BY MOUTH TWICE A DAY FOR 1 DAY AT ONSET OF COLD SORE  3   No current facility-administered medications for this visit.     Family History  Problem Relation Age of Onset  . Diabetes type I Father   . Congenital heart disease Brother   . Alzheimer's disease Maternal Grandfather   . Dementia Maternal Grandmother   . Osteoarthritis Maternal Aunt     Review of Systems  Constitutional: Negative.   HENT: Negative.   Eyes: Negative.   Respiratory: Negative.   Cardiovascular: Negative.   Gastrointestinal: Negative.   Endocrine: Negative.   Genitourinary: Negative.   Musculoskeletal: Negative.   Skin: Negative.   Allergic/Immunologic: Negative.   Neurological: Negative.   Hematological: Negative.   Psychiatric/Behavioral: Negative.     Exam:   BP 110/74 (BP Location: Right Arm, Patient Position: Sitting, Cuff Size: Normal)   Pulse 76   Resp 16   Ht 5' 3.5" (1.613 m)   Wt 180 lb (81.6 kg)   LMP 12/05/2017   BMI 31.39 kg/m     General appearance: alert, cooperative and appears stated age Head: Normocephalic, without obvious abnormality, atraumatic Neck: no adenopathy, supple, symmetrical,  trachea midline and thyroid normal to inspection and palpation Lungs: clear to auscultation bilaterally Breasts: normal appearance, no masses or tenderness, No nipple retraction or dimpling, No nipple discharge or bleeding, No axillary or supraclavicular adenopathy Heart: regular rate and rhythm Abdomen: soft, non-tender; no masses, no organomegaly Extremities: extremities normal, atraumatic, no cyanosis or edema Skin: Skin color, texture, turgor normal. No rashes or lesions Lymph nodes: Cervical, supraclavicular, and axillary nodes normal. No abnormal inguinal nodes palpated Neurologic: Grossly normal  Pelvic:  External genitalia:  no lesions              Urethra:  normal appearing urethra with no masses, tenderness or lesions              Bartholins and Skenes: normal                 Vagina: normal appearing vagina with normal color and discharge, no lesions              Cervix: no lesions.  IUD strings noted.              Pap taken: Yes.   Bimanual Exam:  Uterus:  normal size, contour, position, consistency, mobility, non-tender              Adnexa: no mass, fullness, tenderness              Rectal exam: Yes.  .  Confirms.              Anus:  normal sphincter tone, hemorrhoids noted.   Chaperone was present for exam.  Assessment:   Well woman visit with normal exam. Hx LEEP.  Hx positive HR HPV, negative 16/18/45. Mirena IUD. Smoker.   Plan: Mammogram screening.  She will schedule.  Recommended self breast awareness. Pap and HR HPV as above.  Colpo if pap abnormal or has positive HR HPV.  Guidelines for Calcium, Vitamin D, regular exercise program including cardiovascular and weight bearing exercise. Discussed smoking cessation and benefits for cardiovascular health, reducing cancer risk, and reducing risk of osteoporosis.  Follow up annually and prn.   After visit summary provided.

## 2018-01-02 NOTE — Patient Instructions (Signed)

## 2018-01-05 LAB — CYTOLOGY - PAP
DIAGNOSIS: NEGATIVE
HPV (WINDOPATH): DETECTED — AB

## 2018-01-06 ENCOUNTER — Telehealth: Payer: Self-pay | Admitting: *Deleted

## 2018-01-06 DIAGNOSIS — R87810 Cervical high risk human papillomavirus (HPV) DNA test positive: Secondary | ICD-10-CM

## 2018-01-06 NOTE — Telephone Encounter (Signed)
-----   Message from Patton SallesBrook E Amundson C Silva, MD sent at 01/06/2018  1:13 AM EDT ----- Please contact with results showing normal pap and positive HR HPV.  I am recommending colposcopy with me.  Her pap last year was the same and with negative subtyping 16/18/45.  Patient knows to potentially expect the colposcopy..Lauren Branch

## 2018-01-06 NOTE — Telephone Encounter (Signed)
Call to patient. Voice mail has name confirmation. Left message to call back and ask for triage nurse.

## 2018-01-06 NOTE — Telephone Encounter (Signed)
Spoke with patient, advised as seen below per Dr. Edward JollySilva. IUD for contraceptive. LMP 12/05/17. Patient familiar with colpo, questions answered. Advised to take Motrin 800 mg with food and water one hour before procedure.  Colpo scheduled for 01/23/18 at 2:30pm with Dr. Edward JollySilva.   Order placed for colpo, patient aware she will be called to review benefits prior to procedure.   Routing to provider for final review. Patient is agreeable to disposition. Will close encounter.   Cc: Soundra Pilonosa Davis, [address] [employer] [address]

## 2018-01-09 ENCOUNTER — Telehealth: Payer: Self-pay | Admitting: Obstetrics and Gynecology

## 2018-01-09 NOTE — Telephone Encounter (Signed)
Call placed to convey benefits. 

## 2018-01-12 NOTE — Progress Notes (Signed)
Lauren Branch is a 42 y.o. female is here for follow up.  History of Present Illness:   Lauren Branch, CMA acting as scribe for Dr. Helane RimaErica Roberto Hlavaty.   HPI: Patient is in for follow up on anxiety. She states she is doing much better. Taking medications as prescribed and has made some changes in her work. Took a new positon that has improved her anxiety a lot.   Health Maintenance Due  Topic Date Due  . TETANUS/TDAP  07/15/1996   Dep[facility] screen [facility] 07/15/2017 04/07/2017  Decreased Interest 0 0  Down, Depressed, Hopeless 0 0  PHQ - 2 Score 0 0  Altered sleeping 1 -  Tired, decreased energy 1 -  Change in appetite 0 -  Feeling bad or failure about yourself  1 -  Trouble concentrating 0 -  Moving slowly or fidgety/restless 0 -  Suicidal thoughts 0 -  PHQ-9 Score 3 -  Difficult doing work/chores Not difficult at all -   PMHx, SurgHx, SocialHx, FamHx, Medications, and Allergies were reviewed in the Visit Navigator and updated as appropriate.   Patient Active Problem List   Diagnosis Date Noted  . History of abnormal cervical Pap smear, followed by GYN 01/13/2018  . GAD (generalized anxiety disorder) 04/07/2017  . Residual hemorrhoidal skin tags 04/07/2017  . Obesity (BMI 30-39.9) 04/07/2017  . Cigarette nicotine dependence, precontemplative 04/07/2017  . IUD (intrauterine device) in place 04/07/2017   Social History   Tobacco Use  . Smoking status: Current Every Day Smoker    Packs/day: 0.50    Years: 15.00    Pack years: 7.50    Types: Cigarettes  . Smokeless tobacco: Never Used  Substance Use Topics  . Alcohol use: Yes    Alcohol/week: 1.2 oz    Types: 2 Standard drinks or equivalent per week    Comment: socially  . Drug use: No   Current Medications and Allergies:   .  clonazePAM (KLONOPIN) 0.5 MG tablet, Take 1 tablet (0.5 mg total) by mouth 2 (two) times daily as needed for anxiety., Disp: 90 tablet, Rfl: 1 .  hydrocortisone (ANUSOL-HC) 2.5 % rectal  cream, Place 1 application rectally 2 (two) times daily., Disp: 30 g, Rfl: 0 .  levonorgestrel (MIRENA) 20 MCG/24HR IUD, 1 each by Intrauterine route once. Due for removal 09/28/16., Disp: , Rfl:  .  valACYclovir (VALTREX) 1000 MG tablet, TAKE 2 TABLETS BY MOUTH TWICE A DAY FOR 1 DAY AT ONSET OF COLD SORE, Disp: , Rfl: 3  No Known Allergies   Review of Systems   Pertinent items are noted in the HPI. Otherwise, ROS is negative.  Vitals:   Vitals:   01/13/18 1308  BP: 118/72  Pulse: 95  Temp: 98.6 F (37 C)  TempSrc: Oral  SpO2: 100%  Weight: 178 lb 12.8 oz (81.1 kg)  Height: 5' 3.5" (1.613 m)     Body mass index is 31.18 kg/m.  Physical Exam:   Physical Exam  Constitutional: She is oriented to person, place, and time. She appears well-developed and well-nourished. No distress.  HENT:  Head: Normocephalic and atraumatic.  Right Ear: External ear normal.  Left Ear: External ear normal.  Nose: Nose normal.  Mouth/Throat: Oropharynx is clear and moist.  Eyes: Pupils are equal, round, and reactive to light. Conjunctivae and EOM are normal.  Neck: Normal range of motion. Neck supple. No thyromegaly present.  Cardiovascular: Normal rate, regular rhythm, normal heart sounds and intact distal pulses.  Pulmonary/Chest: Effort  normal and breath sounds normal.  Abdominal: Soft. Bowel sounds are normal.  Musculoskeletal: Normal range of motion.  Lymphadenopathy:    She has no cervical adenopathy.  Neurological: She is alert and oriented to person, place, and time.  Skin: Skin is warm and dry. Capillary refill takes less than 2 seconds.  Psychiatric: She has a normal mood and affect. Her behavior is normal.  Nursing note and vitals reviewed.  Assessment and Plan:   [name] was seen today for follow-up.  Diagnoses and all orders for this visit:  Need for Tdap vaccination -     Tdap vaccine greater than or equal to 7yo IM  Situational anxiety Comments: Okay refill  medications. Taking 1/2 tab q am. Orders: -     clonazePAM (KLONOPIN) 0.5 MG tablet; Take 1 tablet (0.5 mg total) by mouth 2 (two) times daily as needed for anxiety.  History of abnormal cervical Pap smear, followed by GYN   . Reviewed expectations re: course of current medical issues. . Discussed self-management of symptoms. . Outlined signs and symptoms indicating need for more acute intervention. . Patient verbalized understanding and all questions were answered. Lauren Branch Health Maintenance issues including appropriate healthy diet, exercise, and smoking avoidance were discussed with patient. . See orders for this visit as documented in the electronic medical record. . Patient received an After Visit Summary.  Lauren Rima, DO [place] [place] 01/13/2018  Future Appointments  Date Time Provider Department Center  01/23/2018  2:30 PM Patton Salles, MD [facility] None  07/17/2018  3:40 PM Lauren Rima, DO [facility]  01/05/2019  2:00 PM Amundson Shirley Friar, MD [facility] None    CMA served as scribe during this visit. History, Physical, and Plan performed by medical provider. The above documentation has been reviewed and is accurate and complete. Lauren Branch, D.O.

## 2018-01-13 ENCOUNTER — Ambulatory Visit: Payer: Commercial Managed Care - PPO | Admitting: Family Medicine

## 2018-01-13 ENCOUNTER — Encounter: Payer: Self-pay | Admitting: Family Medicine

## 2018-01-13 VITALS — BP 118/72 | HR 95 | Temp 98.6°F | Ht 63.5 in | Wt 178.8 lb

## 2018-01-13 DIAGNOSIS — Z23 Encounter for immunization: Secondary | ICD-10-CM | POA: Diagnosis not present

## 2018-01-13 DIAGNOSIS — F418 Other specified anxiety disorders: Secondary | ICD-10-CM

## 2018-01-13 DIAGNOSIS — Z8742 Personal history of other diseases of the female genital tract: Secondary | ICD-10-CM

## 2018-01-13 DIAGNOSIS — Z87898 Personal history of other specified conditions: Secondary | ICD-10-CM | POA: Diagnosis not present

## 2018-01-13 MED ORDER — CLONAZEPAM 0.5 MG PO TABS: 0.5000 mg | ORAL_TABLET | Freq: Two times a day (BID) | ORAL | 1 refills | Status: DC | PRN

## 2018-01-23 ENCOUNTER — Encounter: Payer: Self-pay | Admitting: Obstetrics and Gynecology

## 2018-01-23 ENCOUNTER — Other Ambulatory Visit: Payer: Self-pay

## 2018-01-23 ENCOUNTER — Ambulatory Visit: Payer: Commercial Managed Care - PPO | Admitting: Obstetrics and Gynecology

## 2018-01-23 DIAGNOSIS — N870 Mild cervical dysplasia: Secondary | ICD-10-CM | POA: Diagnosis not present

## 2018-01-23 DIAGNOSIS — R87810 Cervical high risk human papillomavirus (HPV) DNA test positive: Secondary | ICD-10-CM

## 2018-01-23 DIAGNOSIS — N72 Inflammatory disease of cervix uteri: Secondary | ICD-10-CM | POA: Diagnosis not present

## 2018-01-23 NOTE — Progress Notes (Signed)
  Subjective:     Patient ID: Lauren Branch, female   DOB: Oct 04, 1975, 42 y.o.   MRN: [id number]  HPI Pap History: 01/02/18 Neg:Pos HR HPV 12/29/16 Neg:Pos HR HPV; Neg 40,98,1116,18,45 LEEP for cervical dysplasia in 2005  Review of Systems LMP: 01/05/18 Contraception: Mirena inserted on 11/26/16 Not sexually active for 5 months.     Objective:   Physical Exam  Genitourinary:     Colposcopy Consent for procedure.  3% acetic acid used.  Satisfactory colposcopy.  IUD strings noted.  ECC taken and to pathology.  Mosaics at 5:00 exocervix.  Biopsy taken and to pathology.  AgNO3 used.  Monsel's placed.  Minimal EBL.  No complications.     Assessment:     Normal pap and positive HR HPV.  Hx prior LEEP.  Mirena IUD.     Plan:     FU biopsy results.  Instructions/precautions given.  I anticipated pap and HR HPV testing in 12 months.   After visit summary to patient.

## 2018-01-23 NOTE — Patient Instructions (Signed)
Colposcopy, Care After  This sheet gives you information about how to care for yourself after your procedure. Your doctor may also give you more specific instructions. If you have problems or questions, contact your doctor.  What can I expect after the procedure?  If you did not have a tissue sample removed (did not have a biopsy), you may only have some spotting for a few days. You can go back to your normal activities.  If you had a tissue sample removed, it is common to have:  · Soreness and pain. This may last for a few days.  · Light-headedness.  · Mild bleeding from your vagina or dark-colored, grainy discharge from your vagina. This may last for a few days. You may need to wear a sanitary pad.  · Spotting for at least 48 hours after the procedure.    Follow these instructions at home:  · Take over-the-counter and prescription medicines only as told by your doctor. Ask your doctor what medicines you can start taking again. This is very important if you take blood-thinning medicine.  · Do not drive or use heavy machinery while taking prescription pain medicine.  · For 3 days, or as long as your doctor tells you, avoid:  ? Douching.  ? Using tampons.  ? Having sex.  · If you use birth control (contraception), keep using it.  · Limit activity for the first day after the procedure. Ask your doctor what activities are safe for you.  · It is up to you to get the results of your procedure. Ask your doctor when your results will be ready.  · Keep all follow-up visits as told by your doctor. This is important.  Contact a doctor if:  · You get a skin rash.  Get help right away if:  · You are bleeding a lot from your vagina. It is a lot of bleeding if you are using more than one pad an hour for 2 hours in a row.  · You have clumps of blood (blood clots) coming from your vagina.  · You have a fever.  · You have chills  · You have pain in your lower belly (pelvic area).  · You have signs of infection, such as vaginal  discharge that is:  ? Different than usual.  ? Yellow.  ? Bad-smelling.  · You have very pain or cramps in your lower belly that do not get better with medicine.  · You feel light-headed.  · You feel dizzy.  · You pass out (faint).  Summary  · If you did not have a tissue sample removed (did not have a biopsy), you may only have some spotting for a few days. You can go back to your normal activities.  · If you had a tissue sample removed, it is common to have mild pain and spotting for 48 hours.  · For 3 days, or as long as your doctor tells you, avoid douching, using tampons and having sex.  · Get help right away if you have bleeding, very bad pain, or signs of infection.  This information is not intended to replace advice given to you by your health care provider. Make sure you discuss any questions you have with your health care provider.  Document Released: 12/22/2007 Document Revised: 03/24/2016 Document Reviewed: 03/24/2016  Elsevier Interactive Patient Education © 2018 [employer]

## 2018-07-16 NOTE — Progress Notes (Signed)
Lauren Branch Wangerin is a 42 y.o. female is here for follow up.  History of Present Illness:   HPI:  1. Situational anxiety. Current symptoms: none. No current suicidal and homicidal ideation. Side effects from treatment: none.  Liking job again. Still with home stress. Can go a few weeks without needing medication, but sometimes needing more often. Not interested in therapy, yet.    Health Maintenance Due  Topic Date Due  . INFLUENZA VACCINE  02/16/2018   Dep[facility] screen [facility] 2/9 07/17/2018 01/13/2018 07/15/2017  Decreased Interest 0 0 0  Down, Depressed, Hopeless 0 0 0  PHQ - 2 Score 0 0 0  Altered sleeping 0 0 1  Tired, decreased energy 0 0 1  Change in appetite 0 0 0  Feeling bad or failure about yourself  0 0 1  Trouble concentrating 0 0 0  Moving slowly or fidgety/restless 0 0 0  Suicidal thoughts 0 0 0  PHQ-9 Score 0 0 3  Difficult doing work/chores - Not difficult at all Not difficult at all   PMHx, SurgHx, SocialHx, FamHx, Medications, and Allergies were reviewed in the Visit Navigator and updated as appropriate.   Patient Active Problem List   Diagnosis Date Noted  . History of abnormal cervical Pap smear, followed by GYN 01/13/2018  . GAD (generalized anxiety disorder) 04/07/2017  . Residual hemorrhoidal skin tags 04/07/2017  . Obesity (BMI 30-39.9) 04/07/2017  . Cigarette nicotine dependence, precontemplative 04/07/2017  . IUD (intrauterine device) in place 04/07/2017   Social History   Tobacco Use  . Smoking status: Current Every Day Smoker    Packs/day: 0.50    Years: 15.00    Pack years: 7.50    Types: Cigarettes  . Smokeless tobacco: Never Used  Substance Use Topics  . Alcohol use: Yes    Alcohol/week: 2.0 standard drinks    Types: 2 Standard drinks or equivalent per week    Comment: socially  . Drug use: No   Current Medications and Allergies:   .  clonazePAM (KLONOPIN) 0.5 MG tablet, Take 1 tablet (0.5 mg total) by mouth 2 (two) times daily as  needed for anxiety., Disp: 60 tablet, Rfl: 1 .  levonorgestrel (MIRENA) 20 MCG/24HR IUD, 1 each by Intrauterine route once. Replaced around April or May 2019., Disp: , Rfl:   No Known Allergies   Review of Systems   Pertinent items are noted in the HPI. Otherwise, a complete ROS is negative.  Vitals:   Vitals:   07/17/18 1554  BP: 122/90  Pulse: 86  Temp: 98.3 F (36.8 C)  TempSrc: Oral  SpO2: 98%  Weight: 183 lb (83 kg)     Body mass index is 31.91 kg/m.  Physical Exam:   Physical Exam Vitals signs and nursing note reviewed.  HENT:     Head: Normocephalic and atraumatic.  Eyes:     Pupils: Pupils are equal, round, and reactive to light.  Neck:     Musculoskeletal: Normal range of motion and neck supple.  Cardiovascular:     Rate and Rhythm: Normal rate and regular rhythm.     Heart sounds: Normal heart sounds.  Pulmonary:     Effort: Pulmonary effort is normal.  Abdominal:     Palpations: Abdomen is soft.  Skin:    General: Skin is warm.  Psychiatric:        Behavior: Behavior normal.    Assessment and Plan:   Lauren Branch was seen today for follow-up.  Diagnoses and all  orders for this visit:  Situational anxiety Comments: Okay refill medications. Taking 1/2 tab prn.  Orders: -     clonazePAM (KLONOPIN) 0.5 MG tablet; Take 1 tablet (0.5 mg total) by mouth 2 (two) times daily as needed for anxiety.  Cigarette nicotine dependence without complication Comments: Precontemplative. I advised patient to quit smoking, and offered support. [place] QUITLINE: 1-800-QUIT-NOW (901) 616-8445(1-249-329-3273).  . Orders and follow up as documented in EpicCare, reviewed diet, exercise and weight control, cardiovascular risk and specific lipid/LDL goals reviewed, reviewed medications and side effects in detail.  . Reviewed expectations re: course of current medical issues. . Outlined signs and symptoms indicating need for more acute intervention. . Patient verbalized understanding and all  questions were answered. . Patient received an After Visit Summary.  Helane RimaErica Ramesha Poster, DO [place] 07/18/2018

## 2018-07-17 ENCOUNTER — Ambulatory Visit: Payer: Commercial Managed Care - PPO | Admitting: Family Medicine

## 2018-07-17 ENCOUNTER — Encounter: Payer: Self-pay | Admitting: Family Medicine

## 2018-07-17 VITALS — BP 122/90 | HR 86 | Temp 98.3°F | Wt 183.0 lb

## 2018-07-17 DIAGNOSIS — F418 Other specified anxiety disorders: Secondary | ICD-10-CM | POA: Diagnosis not present

## 2018-07-17 DIAGNOSIS — F17210 Nicotine dependence, cigarettes, uncomplicated: Secondary | ICD-10-CM

## 2018-07-17 MED ORDER — CLONAZEPAM 0.5 MG PO TABS: 0.5000 mg | ORAL_TABLET | Freq: Two times a day (BID) | ORAL | 1 refills | Status: DC | PRN

## 2018-12-18 ENCOUNTER — Telehealth: Payer: Self-pay | Admitting: Obstetrics and Gynecology

## 2018-12-18 NOTE — Telephone Encounter (Signed)
Left message on voicemail to call and reschedule cancelled appointment. °

## 2019-01-05 ENCOUNTER — Ambulatory Visit: Payer: Commercial Managed Care - PPO | Admitting: Obstetrics and Gynecology

## 2019-01-14 NOTE — Progress Notes (Signed)
Virtual Visit via Video   Due to the COVID-19 pandemic, this visit was completed with telemedicine (audio/video) technology to reduce patient and provider exposure as well as to preserve personal protective equipment.   I connected with Lauren Branch by a video enabled telemedicine application and verified that I am speaking with the correct person using two identifiers. Location patient: Home Location provider: [facility], Office Persons participating in the virtual visit: Lauren Branch, Lauren Allcorn, DO   I discussed the limitations of evaluation and management by telemedicine and the availability of in person appointments. The patient expressed understanding and agreed to proceed.  Care Team   Patient Care Team: Helane RimaWallace, Jeselle Hiser, DO as PCP - General (Family Medicine) Floria RavelingAnthony Nottage as Consulting Physician (Dentistry) Patton SallesAmundson C Silva, Brook E, MD as Consulting Physician (Obstetrics and Gynecology)  Subjective:   HPI: Doing very well. Working from home now. Promotion. Nervous about new responsibilities but also excited. Labs at work - she will send to us. All reported as normal. Still smoking, but down to 1 pack per week.   ROS   Patient Active Problem List   Diagnosis Date Noted  . History of abnormal cervical Pap smear, followed by GYN 01/13/2018  . GAD (generalized anxiety disorder) 04/07/2017  . Residual hemorrhoidal skin tags 04/07/2017  . Obesity (BMI 30-39.9) 04/07/2017  . Cigarette nicotine dependence, precontemplative 04/07/2017  . IUD (intrauterine device) in place 04/07/2017    Social History   Tobacco Use  . Smoking status: Light Tobacco Smoker    Packs/day: 0.50    Years: 15.00    Pack years: 7.50    Types: Cigarettes  . Smokeless tobacco: Never Used  Substance Use Topics  . Alcohol use: Yes    Alcohol/week: 2.0 standard drinks    Types: 2 Standard drinks or equivalent per week    Comment: socially    Current Outpatient Medications:  .   clonazePAM (KLONOPIN) 0.5 MG tablet, Take 1 tablet (0.5 mg total) by mouth 2 (two) times daily as needed for anxiety., Disp: 60 tablet, Rfl: 3 .  hydrocortisone (ANUSOL-HC) 2.5 % rectal cream, Place 1 application rectally 2 (two) times daily., Disp: 30 g, Rfl: 0 .  levonorgestrel (MIRENA) 20 MCG/24HR IUD, 1 each by Intrauterine route once. Replaced around April or May 2019., Disp: , Rfl:  .  valACYclovir (VALTREX) 1000 MG tablet, TAKE 2 TABLETS BY MOUTH TWICE A DAY FOR 1 DAY AT ONSET OF COLD SORE, Disp: , Rfl: 3  No Known Allergies  Objective:   VITALS: Per patient if applicable, see vitals. GENERAL: Alert, appears well and in no acute distress. HEENT: Atraumatic, conjunctiva clear, no obvious abnormalities on inspection of external nose and ears. NECK: Normal movements of the head and neck. CARDIOPULMONARY: No increased WOB. Speaking in clear sentences. I:E ratio WNL.  MS: Moves all visible extremities without noticeable abnormality. PSYCH: Pleasant and cooperative, well-groomed. Speech normal rate and rhythm. Affect is appropriate. Insight and judgement are appropriate. Attention is focused, linear, and appropriate.  NEURO: CN grossly intact. Oriented as arrived to appointment on time with no prompting. Moves both UE equally.  SKIN: No obvious lesions, wounds, erythema, or cyanosis noted on face or hands.  [facility] 2/[facility] 07/17/2018 01/13/2018 07/15/2017  Decreased Interest 0 0 0  Down, Depressed, Hopeless 0 0 0  PHQ - 2 Score 0 0 0  Altered sleeping 0 0 1  Tired, decreased energy 0 0 1  Change in appetite 0 0 0  Feeling  bad or failure about yourself  0 0 1  Trouble concentrating 0 0 0  Moving slowly or fidgety/restless 0 0 0  Suicidal thoughts 0 0 0  PHQ-9 Score 0 0 3  Difficult doing work/chores - Not difficult at all Not difficult at all   Assessment and Plan:   Diagnoses and all orders for this visit:  GAD (generalized anxiety disorder)  Cigarette nicotine  dependence, action   Obesity (BMI 30-39.9)  Situational anxiety Comments: Okay refill medications. Taking 1/2 tab prn.  Orders: -     clonazePAM (KLONOPIN) 0.5 MG tablet; Take 1 tablet (0.5 mg total) by mouth 2 (two) times daily as needed for anxiety.   Marland Kitchen COVID-19 Education: The signs and symptoms of COVID-19 were discussed with the patient and how to seek care for testing if needed. The importance of social distancing was discussed today. . Reviewed expectations re: course of current medical issues. . Discussed self-management of symptoms. . Outlined signs and symptoms indicating need for more acute intervention. . Patient verbalized understanding and all questions were answered. Marland Kitchen Health Maintenance issues including appropriate healthy diet, exercise, and smoking avoidance were discussed with patient. . See orders for this visit as documented in the electronic medical record.  Briscoe Deutscher, DO  Records requested if needed. Time spent: 25 minutes, of which >50% was spent in obtaining information about her symptoms, reviewing her previous labs, evaluations, and treatments, counseling her about her condition (please see the discussed topics above), and developing a plan to further investigate it; she had a number of questions which I addressed.

## 2019-01-15 ENCOUNTER — Encounter: Payer: Self-pay | Admitting: Family Medicine

## 2019-01-15 ENCOUNTER — Other Ambulatory Visit: Payer: Self-pay

## 2019-01-15 ENCOUNTER — Ambulatory Visit (INDEPENDENT_AMBULATORY_CARE_PROVIDER_SITE_OTHER): Payer: Commercial Managed Care - PPO | Admitting: Family Medicine

## 2019-01-15 VITALS — Temp 97.3°F | Ht 63.5 in | Wt 173.0 lb

## 2019-01-15 DIAGNOSIS — F17210 Nicotine dependence, cigarettes, uncomplicated: Secondary | ICD-10-CM | POA: Diagnosis not present

## 2019-01-15 DIAGNOSIS — F411 Generalized anxiety disorder: Secondary | ICD-10-CM | POA: Diagnosis not present

## 2019-01-15 DIAGNOSIS — F418 Other specified anxiety disorders: Secondary | ICD-10-CM | POA: Diagnosis not present

## 2019-01-15 DIAGNOSIS — E669 Obesity, unspecified: Secondary | ICD-10-CM | POA: Diagnosis not present

## 2019-01-16 ENCOUNTER — Encounter: Payer: Self-pay | Admitting: Family Medicine

## 2019-01-16 MED ORDER — CLONAZEPAM 0.5 MG PO TABS
0.5000 mg | ORAL_TABLET | Freq: Two times a day (BID) | ORAL | 3 refills | Status: DC | PRN
Start: 2019-01-16 — End: 2019-08-17

## 2019-04-04 ENCOUNTER — Encounter: Payer: Self-pay | Admitting: Obstetrics and Gynecology

## 2019-04-04 ENCOUNTER — Other Ambulatory Visit: Payer: Self-pay | Admitting: Obstetrics and Gynecology

## 2019-04-04 ENCOUNTER — Ambulatory Visit: Payer: Commercial Managed Care - PPO | Admitting: Obstetrics and Gynecology

## 2019-04-04 ENCOUNTER — Other Ambulatory Visit: Payer: Self-pay

## 2019-04-04 ENCOUNTER — Telehealth: Payer: Self-pay | Admitting: Obstetrics and Gynecology

## 2019-04-04 ENCOUNTER — Other Ambulatory Visit (HOSPITAL_COMMUNITY)
Admission: RE | Admit: 2019-04-04 | Discharge: 2019-04-04 | Disposition: A | Discharge status: Home / Self Care | Payer: Commercial Managed Care - PPO | Source: Ambulatory Visit | Attending: Obstetrics and Gynecology | Admitting: Obstetrics and Gynecology

## 2019-04-04 VITALS — BP 120/74 | HR 80 | Temp 97.7°F | Resp 16 | Ht 64.0 in | Wt 165.2 lb

## 2019-04-04 DIAGNOSIS — M7989 Other specified soft tissue disorders: Secondary | ICD-10-CM

## 2019-04-04 DIAGNOSIS — Z01419 Encounter for gynecological examination (general) (routine) without abnormal findings: Secondary | ICD-10-CM | POA: Diagnosis present

## 2019-04-04 MED ORDER — HYDROCORTISONE (PERIANAL) 2.5 % EX CREA: TOPICAL_CREAM | Freq: Two times a day (BID) | CUTANEOUS | 1 refills | Status: DC

## 2019-04-04 NOTE — Telephone Encounter (Signed)
Spoke with Anderson Malta at [facility]. Patient scheduled for Diagnostic MMG and left breast and axillary ultrasound on 04-12-2019 at 0930 with 0910 arrival.   Detailed message left per DPR with the above information. [facility] provided for patient. Also informed patient could call [facility] for cancellations as 04-12-2019 was the first available appointment. Advised patient to return call if any additional questions.

## 2019-04-04 NOTE — Progress Notes (Signed)
43 y.o. G52P2002 Legally Separated Caucasian female here for annual exam.    Occasional spotting with Mirena.   Notes fullness of her left breast/axillary region. Taking Clozepam for anxiety prn.   She did labs at work.  Her triglycerides and cholesterol were elevated.  She will do a recheck of this in December.  She has a fissure of the anal region.  She wants a refill of Anusol HC rectal cream.   Working from home.  Children are 12 and 15.   PCP:  Briscoe Deutscher, DO   No LMP recorded. (Menstrual status: IUD).           Sexually active: No.  Declines STD testing.  The current method of family planning is IUD--Mirena 11/26/16.   Exercising: No.  very active with her children Smoker:  Yes, smokes 3 cigs/day and vaping daily (Juul)  Health Maintenance: Pap: 01-02-18 Neg:Pos HR HPV, 12/29/16 Neg:Pos HR HPV; Neg 31,49,70 History of abnormal Pap:  Yes,01-02-18 Neg:Pos HR HPV with colposcopy revealing inflammation on ECC and cervical biopsy.  12/29/16 Neg:Pos HR HPV; Neg 26,37,85. 2005 Hx LEEP for cervical dysplasia. MMG:  NEVER --knows needs to schedule Colonoscopy:  n/a BMD:   n/a  Result  n/a TDaP: 01-13-18 Gardasil:   no HIV:12-19-14 NR Hep C:12-19-14 Neg Screening Labs:      reports that she has been smoking cigarettes. She has a 3.75 pack-year smoking history. She has never used smokeless tobacco. She reports current alcohol use of about 6.0 standard drinks of alcohol per week. She reports that she does not use drugs.  Past Medical History:  Diagnosis Date  . Abnormal Pap smear of cervix 2005   Cervical dysplasia, s/p LEEP in [place], [place]  . Cigarette nicotine dependence without complication 8/85/0277  . Obesity (BMI 30-39.9) 04/07/2017  . Residual hemorrhoidal skin tags 04/07/2017  . Situational anxiety 04/07/2017    Past Surgical History:  Procedure Laterality Date  . CERVICAL BIOPSY  W/ LOOP ELECTRODE EXCISION  2005   Cerivcal dysplagia [place], [place]  . INTRAUTERINE DEVICE  INSERTION  11/26/2016   Mirena 11/2011 and 11/2016    Current Outpatient Medications  Medication Sig Dispense Refill  . clonazePAM (KLONOPIN) 0.5 MG tablet Take 1 tablet (0.5 mg total) by mouth 2 (two) times daily as needed for anxiety. 60 tablet 3  . levonorgestrel (MIRENA) 20 MCG/24HR IUD 1 each by Intrauterine route once. Replaced around April or May 2019.    . valACYclovir (VALTREX) 1000 MG tablet TAKE 2 TABLETS BY MOUTH TWICE A DAY FOR 1 DAY AT ONSET OF COLD SORE  3   No current facility-administered medications for this visit.     Family History  Problem Relation Age of Onset  . Diabetes type I Father   . Congenital heart disease Brother   . Alzheimer's disease Maternal Grandfather   . Dementia Maternal Grandmother   . Osteoarthritis Maternal Aunt     Review of Systems  Gastrointestinal:       Hemorrhoids/?anal fissure  All other systems reviewed and are negative.   Exam:   BP 120/74   Pulse 80   Temp 97.7 F (36.5 C) (Temporal)   Resp 16   Ht 5\' 4"  (1.626 m)   Wt 165 lb 3.2 oz (74.9 kg)   BMI 28.36 kg/m     General appearance: alert, cooperative and appears stated age Head: normocephalic, without obvious abnormality, atraumatic Neck: no adenopathy, supple, symmetrical, trachea midline and thyroid normal to inspection and palpation Lungs:  clear to auscultation bilaterally Breasts: right - normal appearance, no masses or tenderness, No nipple retraction or dimpling, No nipple discharge or bleeding, No axillary adenopathy Left - axillary fullness,  no masses or tenderness, No nipple retraction or dimpling, No nipple discharge or bleeding, No axillary adenopathy Heart: regular rate and rhythm Abdomen: soft, non-tender; no masses, no organomegaly Extremities: extremities normal, atraumatic, no cyanosis or edema Skin: skin color, texture, turgor normal. No rashes or lesions Lymph nodes: cervical, supraclavicular, and axillary nodes normal. Neurologic: grossly  normal  Pelvic: External genitalia:  no lesions              No abnormal inguinal nodes palpated.              Urethra:  normal appearing urethra with no masses, tenderness or lesions              Bartholins and Skenes: normal                 Vagina: normal appearing vagina with normal color and discharge, no lesions              Cervix: no lesions.  IUD strings noted.              Pap taken: Yes.   Bimanual Exam:  Uterus:  normal size, contour, position, consistency, mobility, non-tender              Adnexa: no mass, fullness, tenderness              Rectal exam: Yes.  .  Confirms.              Anus:  normal sphincter tone, no lesions  Chaperone was present for exam.  Assessment:   Well woman visit with normal exam. Hx LEEP.  Hx positive HR HPV, negative 16/18/45. Mirena IUD. Smoker.  Vapes.  Elevated triglycerides. Rectal fissure.  Plan: Bilateral dx mammogram and left breast US.  Self breast awareness reviewed. Pap and HR HPV as above. Guidelines for Calcium, Vitamin D, regular exercise program including cardiovascular and weight bearing exercise. I recommended smoking cessation and stopping vaping.  Will scan in her labs.  Rx for Anusol HC.  Follow up annually and prn.   After visit summary provided.

## 2019-04-04 NOTE — Telephone Encounter (Signed)
Please schedule dx bilateral mammogram and left breast and axillary ultrasound.  [facility]   She has left axillary fullness.

## 2019-04-04 NOTE — Patient Instructions (Signed)

## 2019-04-10 LAB — CYTOLOGY - PAP
Diagnosis: NEGATIVE
High risk HPV: NEGATIVE
Molecular Disclaimer: 56
Molecular Disclaimer: DETECTED
Molecular Disclaimer: NORMAL

## 2019-04-12 ENCOUNTER — Ambulatory Visit
Admission: RE | Admit: 2019-04-12 | Discharge: 2019-04-12 | Disposition: A | Discharge status: Home / Self Care | Payer: Commercial Managed Care - PPO | Source: Ambulatory Visit | Attending: Obstetrics and Gynecology | Admitting: Obstetrics and Gynecology

## 2019-04-12 ENCOUNTER — Other Ambulatory Visit: Payer: Self-pay

## 2019-04-12 DIAGNOSIS — M7989 Other specified soft tissue disorders: Secondary | ICD-10-CM

## 2019-04-18 ENCOUNTER — Telehealth: Payer: Self-pay

## 2019-04-18 NOTE — Telephone Encounter (Signed)
Left message to call Shavana Calder, CMA. °

## 2019-04-18 NOTE — Telephone Encounter (Signed)
-----   Message from Nunzio Cobbs, MD sent at 04/14/2019  7:13 AM EDT ----- Please contact patient with results.  Pap normal and negative HR HPV.  There were signs of potential BV on her pap.  If she is having symptoms of vaginitis, she treat with an abx. Options are Flagyl 500 mg po bid for 7 days or Metrogel pv at hs for 5 nights.  Please send Rx to pharmacy of choice. ETOH precautions.   Pap recall - 12 months.  Hx of positive HR HPV. Hx prior LEEP.  I am highlighting this result so you know to contact the patient.

## 2019-04-24 NOTE — Telephone Encounter (Signed)
Called patient and left 2nd message asking her to return call to [place].

## 2019-04-26 NOTE — Telephone Encounter (Signed)
Spoke with patient and reviewed normal pap/neg HR HPV results. Advised will need pap at [facility] next year. Patient denies any BV symptoms and declines treatment.  12 recall entered.

## 2019-05-30 LAB — BASIC METABOLIC PANEL
Creatinine: 0.6 (ref 0.5–1.1)
Glucose: 78
Potassium: 3.9 (ref 3.4–5.3)
Sodium: 142 (ref 137–147)

## 2019-05-30 LAB — LIPID PANEL
Cholesterol: 178 (ref 0–200)
HDL: 46 (ref 35–70)
LDL Cholesterol: 98
LDl/HDL Ratio: 34
Triglycerides: 200 — AB (ref 40–160)

## 2019-05-30 LAB — CBC AND DIFFERENTIAL
Hemoglobin: 13.8 (ref 12.0–16.0)
Platelets: 300 (ref 150–399)
WBC: 7.7

## 2019-05-30 LAB — HEPATIC FUNCTION PANEL
ALT: 14 (ref 7–35)
AST: 19 (ref 13–35)

## 2019-05-30 LAB — HEMOGLOBIN A1C: Hemoglobin A1C: 4.8

## 2019-05-30 LAB — TSH: TSH: 1.42 (ref 0.41–5.90)

## 2019-07-04 ENCOUNTER — Other Ambulatory Visit: Payer: Self-pay

## 2019-07-04 ENCOUNTER — Encounter: Payer: Self-pay | Admitting: Family Medicine

## 2019-07-04 ENCOUNTER — Ambulatory Visit (INDEPENDENT_AMBULATORY_CARE_PROVIDER_SITE_OTHER): Payer: Commercial Managed Care - PPO | Admitting: Family Medicine

## 2019-07-04 ENCOUNTER — Telehealth: Payer: Self-pay | Admitting: Family Medicine

## 2019-07-04 VITALS — BP 122/74 | HR 82 | Temp 97.3°F | Ht 64.0 in | Wt 171.3 lb

## 2019-07-04 DIAGNOSIS — Z975 Presence of (intrauterine) contraceptive device: Secondary | ICD-10-CM | POA: Diagnosis not present

## 2019-07-04 DIAGNOSIS — Z8742 Personal history of other diseases of the female genital tract: Secondary | ICD-10-CM

## 2019-07-04 DIAGNOSIS — F17210 Nicotine dependence, cigarettes, uncomplicated: Secondary | ICD-10-CM

## 2019-07-04 DIAGNOSIS — F411 Generalized anxiety disorder: Secondary | ICD-10-CM | POA: Diagnosis not present

## 2019-07-04 DIAGNOSIS — Z9889 Other specified postprocedural states: Secondary | ICD-10-CM

## 2019-07-04 HISTORY — DX: Other specified postprocedural states: Z98.890

## 2019-07-04 NOTE — Progress Notes (Signed)
Subjective  CC:  Chief Complaint  Patient presents with  . Transitions Of Care    tranisitioning from Dr. Earlene Plater. 46month f/u for GAD  . Anxiety    HPI: Lauren Branch is a 43 y.o. female who presents to [facility] today to establish care with me as a new patient.  Reviewed her chart, records from gyn and pcp. Labs and pathology reports.  She has the following concerns or needs:  Pleasant separated, single mother of 2 girls who lives with her mom after leaving her husband 6 years ago. Works at [employer] and is currently liking her job. Brings in fasting labs from recent [facility] [place] screening visit: nl cbc (mcv 103), cmp, lipids, tsh and a1c.  Has long h/o GAD worsened by familial stress and work stress; this had been treated with SSRI's in the past. She did well with her last one (? Lexapro) but stopped it after she was feeling better. This was about 2-3 years ago. Since, Dr. Earlene Plater has been treating with prn klonopin. She says she can go a few days without using it. It definitely helps alleviate her stress symptoms. She admits to chronic underlying anxiety causing anxiousness, easy flushing, emotional lability w/o depressive sxs. No AEs.   Smoker: down to about 3/day. But now vaping some. Admits it is a stress/anxiety reliever.   Reviewed h/o LEEP for CIN and most recent neg pap with HR HPV with neg colpo. Now getting annual paps, most recent was neg pap and neg HR HPV. Managed by Dr. Edward Jolly  IUD for dysmenorrhea and heavy cycles. 3rd IUD, amenorrheic and loves it. Not currently in a sexual relationship.  Depression screen [facility] 2/9 07/04/2019 07/17/2018 01/13/2018 07/15/2017 04/07/2017  Decreased Interest 1 0 0 0 0  Down, Depressed, Hopeless 0 0 0 0 0  PHQ - 2 Score 1 0 0 0 0  Altered sleeping 1 0 0 1 -  Tired, decreased energy 1 0 0 1 -  Change in appetite 1 0 0 0 -  Feeling bad or failure about yourself  1 0 0 1 -  Trouble concentrating 0 0 0 0  -  Moving slowly or fidgety/restless 0 0 0 0 -  Suicidal thoughts 0 0 0 0 -  PHQ-9 Score 5 0 0 3 -  Difficult doing work/chores Somewhat difficult - Not difficult at all Not difficult at all -   GAD 7 : Generalized Anxiety Score 07/04/2019 01/15/2019 07/17/2018 01/13/2018  Nervous, Anxious, on Edge 2 3 2 1   Control/stop worrying 2 1 1 1   Worry too much - different things 2 1 1 1   Trouble relaxing 1 0 1 1  Restless 1 0 1 0  Easily annoyed or irritable 1 0 1 0  Afraid - awful might happen 0 0 0 0  Total GAD 7 Score 9 5 7 4   Anxiety Difficulty Somewhat difficult Not difficult at all Not difficult at all Not difficult at all     Assessment  1. GAD (generalized anxiety disorder)   2. Cigarette nicotine dependence, precontemplative   3. IUD (intrauterine device) in place   4. History of abnormal cervical Pap smear w/ neg colpo 2019    5. History of loop electrical excision procedure (LEEP)      Plan   Today's visit was 25 minutes long. Greater than 50% of this time was devoted to face to face counseling with the patient and coordination of care. We discussed  her diagnosis, prognosis, treatment options and treatment plan is documented below.  I reviewed patient's records from the PMP aware controlled substance registry today.   GAD: continues to be active; discussed better management strategies. Discussed goals of treatment are to have her feel well overall with rare bouts of anxiety requiring benzo. After discussion, pt agrees to restart her last SSRI. She will message me with the name and I will order it for her. Close f/u. Life stressors are much lower than they had been in the past but anxiety sxs persist. C/w GAD; recommend daily SSRI. Discussed risks/benefits and cautions. See avs.   Tobacco cessation: also will be able to work on cessation if anxiety is well controlled.   IUD: effective. Safe sex precautions discussed.   H/o abnl pap: on annual surveillance.  HM: up to date.  Cholesterol and weight are improved. Annual cpe at pts convenience.   Follow up:  Return in about 8 weeks (around 08/29/2019) for mood follow up. No orders of the defined types were placed in this encounter.  No orders of the defined types were placed in this encounter.     We updated and reviewed the patient's past history in detail and it is documented below.  Patient Active Problem List   Diagnosis Date Noted  . History of loop electrical excision procedure (LEEP) 07/04/2019  . History of abnormal cervical Pap smear w/ neg colpo 2019  01/13/2018  . GAD (generalized anxiety disorder) 04/07/2017  . Residual hemorrhoidal skin tags 04/07/2017  . Cigarette nicotine dependence, precontemplative 04/07/2017  . IUD (intrauterine device) in place 04/07/2017   Health Maintenance  Topic Date Due  . INFLUENZA VACCINE  02/17/2019  . PAP SMEAR-Modifier  04/03/2020  . TETANUS/TDAP  01/14/2028  . HIV Screening  Completed   Immunization History  Administered Date(s) Administered  . Influenza Inj Mdck Quad With Preservative 04/17/2018  . Influenza,inj,Quad PF,6+ Mos 05/15/2017  . Tdap 01/13/2018   Current Meds  Medication Sig  . clonazePAM (KLONOPIN) 0.5 MG tablet Take 1 tablet (0.5 mg total) by mouth 2 (two) times daily as needed for anxiety.  Marland Kitchen levonorgestrel (MIRENA) 20 MCG/24HR IUD 1 each by Intrauterine route once. Replaced around April or May 2019.  . valACYclovir (VALTREX) 1000 MG tablet TAKE 2 TABLETS BY MOUTH TWICE A DAY FOR 1 DAY AT ONSET OF COLD SORE    Allergies: Patient has No Known Allergies. Past Medical History Patient  has a past medical history of Abnormal Pap smear of cervix (2005), Cigarette nicotine dependence without complication (01/17/6377), History of loop electrical excision procedure (LEEP) (07/04/2019), Obesity (BMI 30-39.9) (04/07/2017), Residual hemorrhoidal skin tags (04/07/2017), and Situational anxiety (04/07/2017). Past Surgical History Patient  has a past  surgical history that includes Cervical biopsy w/ loop electrode excision (2005) and Intrauterine device insertion (11/26/2016). Family History: Patient family history includes Alzheimer's disease in her maternal grandfather; Congenital heart disease in her brother; Dementia in her maternal grandmother; Diabetes type I in her father; Healthy in her daughter and daughter; Osteoarthritis in her maternal aunt. Social History:  Patient  reports that she has been smoking cigarettes. She has a 3.75 pack-year smoking history. She has never used smokeless tobacco. She reports current alcohol use of about 6.0 standard drinks of alcohol per week. She reports that she does not use drugs.  Review of Systems: Constitutional: negative for fever or malaise Ophthalmic: negative for photophobia, double vision or loss of vision Cardiovascular: negative for chest pain, dyspnea on exertion, or new  LE swelling Respiratory: negative for SOB or persistent cough Gastrointestinal: negative for abdominal pain, change in bowel habits or melena Genitourinary: negative for dysuria or gross hematuria Musculoskeletal: negative for new gait disturbance or muscular weakness Integumentary: negative for new or persistent rashes Neurological: negative for TIA or stroke symptoms Psychiatric: negative for SI or delusions Allergic/Immunologic: negative for hives  Patient Care Team    Relationship Specialty Notifications Start End  Willow OraAndy, Keyundra Fant L, MD PCP - General Family Medicine  07/04/19   Floria RavelingAnthony Nottage Consulting Physician Dentistry  03/18/17   Patton SallesAmundson C Silva, Brook E, MD Consulting Physician Obstetrics and Gynecology  01/13/18     Objective  Vitals: BP 122/74 (BP Location: Right Arm, Patient Position: Sitting, Cuff Size: Normal)   Pulse 82   Temp (!) 97.3 F (36.3 C) (Temporal)   Ht 5\' 4"  (1.626 m)   Wt 171 lb 4.8 oz (77.7 kg)   SpO2 98%   BMI 29.40 kg/m  General:  Well developed, well nourished, no acute  distress  Psych:  Alert and oriented, anxious appearing, fair insight, no agitation or hyperkinetic mvts   Commons side effects, risks, benefits, and alternatives for medications and treatment plan prescribed today were discussed, and the patient expressed understanding of the given instructions. Patient is instructed to call or message via MyChart if he/she has any questions or concerns regarding our treatment plan. No barriers to understanding were identified. We discussed Red Flag symptoms and signs in detail. Patient expressed understanding regarding what to do in case of urgent or emergency type symptoms.   Medication list was reconciled, printed and provided to the patient in AVS. Patient instructions and summary information was reviewed with the patient as documented in the AVS. This note was prepared with assistance of Dragon voice recognition software. Occasional wrong-word or sound-a-like substitutions may have occurred due to the inherent limitations of voice recognition software  This visit occurred during the SARS-CoV-2 public health emergency.  Safety protocols were in place, including screening questions prior to the visit, additional usage of staff PPE, and extensive cleaning of exam room while observing appropriate contact time as indicated for disinfecting solutions.

## 2019-07-04 NOTE — Patient Instructions (Addendum)
Please return in 6-8 weeks to recheck mood.   Please sign up for mychart. I sent you the link via text. Then you can message me with the name of your anti-anxiety medication that you used to be on.  I will then order it for you.  I think it will help!  Happy holidays! It was a pleasure meeting you today! Thank you for choosing [place] to meet your healthcare needs! I truly look forward to working with you. If you have any questions or concerns, please send me a message via Mychart or call the office at [phone].  Depression/anxiety Medications:  Taking the medicine as directed and not missing any doses is one of the best things you can do to treat your sympotms.  Here are some things to keep in mind:  1) Side effects (stomach upset, some increased anxiety) may happen before you notice a benefit.  These side effects typically go away over time. 2) Changes to your dose of medicine or a change in medication all together is sometimes necessary 3) Most people need to be on medication at least 6-12 months 4) Many people will notice an improvement within two weeks but the full effect of the medication can take up to 4-6 weeks 5) Stopping the medication when you start feeling better often results in a return of symptoms 6) If you start having thoughts of hurting yourself or others after starting this medicine, please call the office immediately at [phone].

## 2019-07-04 NOTE — Telephone Encounter (Signed)
Medication Refill - Medication: Citalopram HBR 40 MG Take 1 tablet daily   This is the medication she wanted to share   Has the patient contacted their pharmacy? Yes.   (Agent: If no, request that the patient contact the pharmacy for the refill.) (Agent: If yes, when and what did the pharmacy advise?)  Preferred Pharmacy (with phone number or street name):  [id number] - [place], [address] [place] [address]  Phone: [phone] Fax: [phone]       Agent: Please be advised that RX refills may take up to 3 business days. We ask that you follow-up with your pharmacy.

## 2019-07-05 MED ORDER — CITALOPRAM HYDROBROMIDE 20 MG PO TABS: 20.0000 mg | ORAL_TABLET | Freq: Every day | ORAL | 3 refills | Status: DC

## 2019-07-05 NOTE — Telephone Encounter (Signed)
Please call patient: I have ordered celexa to start taking as we discussed yesterday. Will start at 20mg  daily dose. F/u in 6-8 weeks to see how it is doing and we can adjust dose from there if needed.   Thanks!

## 2019-07-05 NOTE — Telephone Encounter (Signed)
FYI

## 2019-07-05 NOTE — Telephone Encounter (Signed)
LVM for patient to return call. 

## 2019-07-29 ENCOUNTER — Other Ambulatory Visit: Payer: Self-pay | Admitting: Family Medicine

## 2019-08-17 ENCOUNTER — Other Ambulatory Visit: Payer: Self-pay

## 2019-08-17 ENCOUNTER — Ambulatory Visit (INDEPENDENT_AMBULATORY_CARE_PROVIDER_SITE_OTHER): Payer: Commercial Managed Care - PPO | Admitting: Family Medicine

## 2019-08-17 ENCOUNTER — Encounter: Payer: Self-pay | Admitting: Family Medicine

## 2019-08-17 VITALS — BP 132/76 | HR 90 | Temp 97.2°F | Ht 64.0 in | Wt 168.4 lb

## 2019-08-17 DIAGNOSIS — F418 Other specified anxiety disorders: Secondary | ICD-10-CM | POA: Diagnosis not present

## 2019-08-17 DIAGNOSIS — F411 Generalized anxiety disorder: Secondary | ICD-10-CM | POA: Diagnosis not present

## 2019-08-17 DIAGNOSIS — Z23 Encounter for immunization: Secondary | ICD-10-CM

## 2019-08-17 MED ORDER — SERTRALINE HCL 25 MG PO TABS: ORAL_TABLET | ORAL | 2 refills | Status: DC

## 2019-08-17 MED ORDER — CLONAZEPAM 0.5 MG PO TABS: 0.5000 mg | ORAL_TABLET | Freq: Two times a day (BID) | ORAL | 3 refills | Status: DC | PRN

## 2019-08-17 NOTE — Progress Notes (Signed)
Subjective  CC:  Chief Complaint  Patient presents with  . Anxiety    Patient states that her anxiety is worse. Patient is taking her Citalopram and states that it is not helping much. Patient states she had to take more Klonopin. She was taking it as needed before.    HPI: Lauren Branch is a 44 y.o. female who presents to the office today to address the problems listed above in the chief complaint, mood problems.  Here for follow-up of general anxiety disorder after restarting citalopram.  This has helped her in the past however, is not making much of a difference in her mood right now.  She continues to struggle with anxiety and low mood.  She denies panic symptoms.  She is using Klonopin daily. She denies current suicidal or homicidal plan or intent.  Depression screen [facility] 2/9 07/04/2019 07/17/2018 01/13/2018  Decreased Interest 1 0 0  Down, Depressed, Hopeless 0 0 0  PHQ - 2 Score 1 0 0  Altered sleeping 1 0 0  Tired, decreased energy 1 0 0  Change in appetite 1 0 0  Feeling bad or failure about yourself  1 0 0  Trouble concentrating 0 0 0  Moving slowly or fidgety/restless 0 0 0  Suicidal thoughts 0 0 0  PHQ-9 Score 5 0 0  Difficult doing work/chores Somewhat difficult - Not difficult at all   GAD 7 : Generalized Anxiety Score 07/04/2019 01/15/2019 07/17/2018 01/13/2018  Nervous, Anxious, on Edge 2 3 2 1   Control/stop worrying 2 1 1 1   Worry too much - different things 2 1 1 1   Trouble relaxing 1 0 1 1  Restless 1 0 1 0  Easily annoyed or irritable 1 0 1 0  Afraid - awful might happen 0 0 0 0  Total GAD 7 Score 9 5 7 4   Anxiety Difficulty Somewhat difficult Not difficult at all Not difficult at all Not difficult at all     Assessment  1. GAD (generalized anxiety disorder)   2. Situational anxiety   3. Need for influenza vaccination      Plan   Generalized anxiety disorder: Response to Celexa.  Change to Zoloft.  Educated on appropriate tapers.  Continue Klonopin  for now.  Close follow-up.  Recheck 6 weeks.  Also recommend therapy.  Circumstantial stressors that are activating worsening her anxiety symptoms.  Updated flu vaccination  Reviewed concept of mood problems caused by biochemical imbalance of neurotransmitters and rationale for treatment with medications and therapy.   Counseling given: pt was instructed to contact office, on-call physician or crisis Hotline if symptoms worsen significantly. If patient develops any suicidal or homicidal thoughts, she is directed to the ER immediately.   Follow up: 6 to 8 weeks for follow-up Orders Placed This Encounter  Procedures  . Flu Vaccine QUAD 6+ mos PF IM (Fluarix Quad PF)   Meds ordered this encounter  Medications  . clonazePAM (KLONOPIN) 0.5 MG tablet    Sig: Take 1 tablet (0.5 mg total) by mouth 2 (two) times daily as needed for anxiety.    Dispense:  60 tablet    Refill:  3  . sertraline (ZOLOFT) 25 MG tablet    Sig: Take 1 tablet (25 mg total) by mouth daily for 7 days, THEN 2 tablets (50 mg total) daily.    Dispense:  60 tablet    Refill:  2      I reviewed the patients updated PMH, FH, and  SocHx.    Patient Active Problem List   Diagnosis Date Noted  . History of loop electrical excision procedure (LEEP) 07/04/2019  . History of abnormal cervical Pap smear w/ neg colpo 2019  01/13/2018  . GAD (generalized anxiety disorder) 04/07/2017  . Residual hemorrhoidal skin tags 04/07/2017  . Cigarette nicotine dependence, precontemplative 04/07/2017  . IUD (intrauterine device) in place 04/07/2017   Current Meds  Medication Sig  . clonazePAM (KLONOPIN) 0.5 MG tablet Take 1 tablet (0.5 mg total) by mouth 2 (two) times daily as needed for anxiety.  Marland Kitchen levonorgestrel (MIRENA) 20 MCG/24HR IUD 1 each by Intrauterine route once. Replaced around April or May 2019.  . valACYclovir (VALTREX) 1000 MG tablet TAKE 2 TABLETS BY MOUTH TWICE A DAY FOR 1 DAY AT ONSET OF COLD SORE  . [DISCONTINUED]  citalopram (CELEXA) 20 MG tablet TAKE 1 TABLET BY MOUTH EVERY DAY  . [DISCONTINUED] clonazePAM (KLONOPIN) 0.5 MG tablet Take 1 tablet (0.5 mg total) by mouth 2 (two) times daily as needed for anxiety.    Allergies: Patient has No Known Allergies. Family history:  Patient family history includes Alzheimer's disease in her maternal grandfather; Congenital heart disease in her brother; Dementia in her maternal grandmother; Diabetes type I in her father; Healthy in her daughter and daughter; Osteoarthritis in her maternal aunt. Social History   Socioeconomic History  . Marital status: Legally Separated    Spouse name: Not on file  . Number of children: 2  . Years of education: Not on file  . Highest education level: Not on file  Occupational History    Employer: [employer]  Tobacco Use  . Smoking status: Light Tobacco Smoker    Packs/day: 0.25    Years: 15.00    Pack years: 3.75    Types: Cigarettes  . Smokeless tobacco: Never Used  . Tobacco comment: smokes 3 cigarettes/day  Substance and Sexual Activity  . Alcohol use: Yes    Alcohol/week: 6.0 standard drinks    Types: 6 Standard drinks or equivalent per week    Comment: socially  . Drug use: No  . Sexual activity: Not Currently    Partners: Male    Birth control/protection: I.U.D.    Comment: Mirena  removal date 09-28-16  Other Topics Concern  . Not on file  Social History Narrative  . Not on file   Social Determinants of Health   Financial Resource Strain:   . Difficulty of Paying Living Expenses: Not on file  Food Insecurity:   . Worried About Programme researcher, broadcasting/film/video in the Last Year: Not on file  . Ran Out of Food in the Last Year: Not on file  Transportation Needs:   . Lack of Transportation (Medical): Not on file  . Lack of Transportation (Non-Medical): Not on file  Physical Activity:   . Days of Exercise per Week: Not on file  . Minutes of Exercise per Session: Not on file  Stress:   . Feeling of Stress :  Not on file  Social Connections:   . Frequency of Communication with Friends and Family: Not on file  . Frequency of Social Gatherings with Friends and Family: Not on file  . Attends Religious Services: Not on file  . Active Member of Clubs or Organizations: Not on file  . Attends Banker Meetings: Not on file  . Marital Status: Not on file     Review of Systems: Constitutional: Negative for fever malaise or anorexia Cardiovascular: negative for  chest pain Respiratory: negative for SOB or persistent cough Gastrointestinal: negative for abdominal pain  Objective  Vitals: BP 132/76 (BP Location: Left Arm, Patient Position: Sitting, Cuff Size: Normal)   Pulse 90   Temp (!) 97.2 F (36.2 C) (Temporal)   Ht 5\' 4"  (1.626 m)   Wt 168 lb 6.4 oz (76.4 kg)   SpO2 98%   BMI 28.91 kg/m  General: no acute distress, well appearing, no apparent distress, well groomed Psych:  Alert and oriented x 3,agitated and anxious.      Commons side effects, risks, benefits, and alternatives for medications and treatment plan prescribed today were discussed, and the patient expressed understanding of the given instructions. Patient is instructed to call or message via MyChart if he/she has any questions or concerns regarding our treatment plan. No barriers to understanding were identified. We discussed Red Flag symptoms and signs in detail. Patient expressed understanding regarding what to do in case of urgent or emergency type symptoms.   Medication list was reconciled, printed and provided to the patient in AVS. Patient instructions and summary information was reviewed with the patient as documented in the AVS. This note was prepared with assistance of Dragon voice recognition software. Occasional wrong-word or sound-a-like substitutions may have occurred due to the inherent limitations of voice recognition software

## 2019-08-17 NOTE — Patient Instructions (Signed)
Please return in 6-8 weeks to recheck mood.   Please call [facility] to schedule an appointment with Dr. Colen Darling; she is a therapist here at our [facility] [place] [facility] office.  The phone number is: [phone]  Today you were given your flu vaccination.    If you have any questions or concerns, please don't hesitate to send me a message via MyChart or call the office at [phone]. Thank you for visiting with [place] today! It's our pleasure caring for you.  Please take the celexa every other day for 1 week, then take in T and Th, then stop.  Can start the zoloft now.

## 2019-09-19 ENCOUNTER — Other Ambulatory Visit: Payer: Self-pay | Admitting: Family Medicine

## 2019-10-05 ENCOUNTER — Ambulatory Visit: Payer: Managed Care, Other (non HMO) | Admitting: Family Medicine

## 2019-10-22 ENCOUNTER — Ambulatory Visit: Payer: Managed Care, Other (non HMO) | Admitting: Family Medicine

## 2019-11-05 ENCOUNTER — Ambulatory Visit: Payer: Managed Care, Other (non HMO) | Admitting: Family Medicine

## 2019-11-05 DIAGNOSIS — Z0289 Encounter for other administrative examinations: Secondary | ICD-10-CM

## 2019-11-15 ENCOUNTER — Encounter: Payer: Self-pay | Admitting: Family Medicine

## 2019-12-24 ENCOUNTER — Other Ambulatory Visit: Payer: Self-pay

## 2019-12-24 ENCOUNTER — Encounter: Payer: Self-pay | Admitting: Family Medicine

## 2019-12-24 ENCOUNTER — Telehealth (INDEPENDENT_AMBULATORY_CARE_PROVIDER_SITE_OTHER): Payer: Managed Care, Other (non HMO) | Admitting: Family Medicine

## 2019-12-24 DIAGNOSIS — F411 Generalized anxiety disorder: Secondary | ICD-10-CM

## 2019-12-24 NOTE — Progress Notes (Signed)
Virtual Visit via Video Note  SUBJECTIVE CC:  Chief Complaint  Patient presents with  . Anxiety    patient states that anxiety is well controlled    I connected with Lolita Rieger on 12/24/19 at  3:30 PM EDT by a video enabled telemedicine application and verified that I am speaking with the correct person using two identifiers. Location patient: Home Location provider: [facility] participating in the virtual visit: Zamariyah Furukawa, Leamon Arnt, MD Reymundo Poll, [place] discussed the limitations of evaluation and management by telemedicine and the availability of in person appointments. The patient expressed understanding and agreed to proceed.  HPI: Ermal Brzozowski is a 44 y.o. female who was contacted today to address the problems listed above in the chief complaint/mood.  Doing much better. Has responded very well to zoloft. Less anxious worried and overall feels brighter w/o side effects. Using klonopin on occ, mostly related to work stressors or travel soccer ball stressors. No panic sxs. Takes a klonopin when "she can feel her body feel anxious or off". Reviewed drug database. Refilled in June - 1st refill since January. (had been on it bid).  Depression screen [facility] 2/9 07/04/2019 07/17/2018 01/13/2018  Decreased Interest 1 0 0  Down, Depressed, Hopeless 0 0 0  PHQ - 2 Score 1 0 0  Altered sleeping 1 0 0  Tired, decreased energy 1 0 0  Change in appetite 1 0 0  Feeling bad or failure about yourself  1 0 0  Trouble concentrating 0 0 0  Moving slowly or fidgety/restless 0 0 0  Suicidal thoughts 0 0 0  PHQ-9 Score 5 0 0  Difficult doing work/chores Somewhat difficult - Not difficult at all   GAD 7 : Generalized Anxiety Score 12/24/2019 07/04/2019 01/15/2019 07/17/2018  Nervous, Anxious, on Edge 1 2 3 2   Control/stop worrying 0 2 1 1   Worry too much - different things 1 2 1 1   Trouble relaxing 0 1 0 1  Restless 0 1 0 1  Easily  annoyed or irritable 0 1 0 1  Afraid - awful might happen 0 0 0 0  Total GAD 7 Score 2 9 5 7   Anxiety Difficulty - Somewhat difficult Not difficult at all Not difficult at all     ASSESSMENT 1. GAD (generalized anxiety disorder)      GAD:  Much improved. Good response to zoloft. Counseling done. Will monitor use of klonopin. Try behavioral strategies prior to reaching for the klonopin to see if she really needs it. As well, if use is more than rare, then would rec increasing zoloft dose. Patient understands and agrees with care plan.   F/u in November for recheck and cpe. Sooner if needed for worsening anxiety or increase use of klonopin  I discussed the assessment and treatment plan with the patient. The patient was provided an opportunity to ask questions and all were answered. The patient agreed with the plan and demonstrated an understanding of the instructions.   The patient was advised to call back or seek an in-person evaluation if the symptoms worsen or if the condition fails to improve as anticipated. Follow up: November 2021 CPE and mood recheck. Will get labs with [facility].  Visit date not found  No orders of the defined types were placed in this encounter.     I reviewed the patients updated PMH, FH, and SocHx.    Patient Active  Problem List   Diagnosis Date Noted  . History of loop electrical excision procedure (LEEP) 07/04/2019  . History of abnormal cervical Pap smear w/ neg colpo 2019  01/13/2018  . GAD (generalized anxiety disorder) 04/07/2017  . Residual hemorrhoidal skin tags 04/07/2017  . Cigarette nicotine dependence, precontemplative 04/07/2017  . IUD (intrauterine device) in place 04/07/2017   Current Meds  Medication Sig  . clonazePAM (KLONOPIN) 0.5 MG tablet Take 1 tablet (0.5 mg total) by mouth 2 (two) times daily as needed for anxiety.  Marland Kitchen levonorgestrel (MIRENA) 20 MCG/24HR IUD 1 each by Intrauterine route once. Replaced around April or May  2019.  . sertraline (ZOLOFT) 50 MG tablet Take 1 tablet (50 mg total) by mouth daily.  . valACYclovir (VALTREX) 1000 MG tablet TAKE 2 TABLETS BY MOUTH TWICE A DAY FOR 1 DAY AT ONSET OF COLD SORE    Allergies: Patient has No Known Allergies. Family History: Patient family history includes Alzheimer's disease in her maternal grandfather; Congenital heart disease in her brother; Dementia in her maternal grandmother; Diabetes type I in her father; Healthy in her daughter and daughter; Osteoarthritis in her maternal aunt. Social History:  Patient  reports that she has been smoking cigarettes. She has a 3.75 pack-year smoking history. She has never used smokeless tobacco. She reports current alcohol use of about 6.0 standard drinks of alcohol per week. She reports that she does not use drugs.  Review of Systems: Constitutional: Negative for fever malaise or anorexia Cardiovascular: negative for chest pain Respiratory: negative for SOB or persistent cough Gastrointestinal: negative for abdominal pain  OBJECTIVE/OBSERVATIONS: General: no acute distress, well appearing, no apparent distress, well groomed Psych:  Alert and oriented x 3,normal mood, behavior, speech, dress, and thought processes.   Willow Ora, MD

## 2020-02-20 IMAGING — US US AXILLARY LEFT
1 series · 4 of 4 positions shown · non-contrast
Comparison: None

CLINICAL DATA: 42-year-old patient presents for baseline mammogram
and evaluation of left axillary puffiness and soreness
intermittently for the past 2 months.

EXAM:
DIGITAL DIAGNOSTIC BILATERAL MAMMOGRAM WITH CAD AND TOMO
ULTRASOUND LEFT BREAST

[Series 1: us axillary left · 0.08mm/px · 4 of 4 slices shown]
[im 1/4]
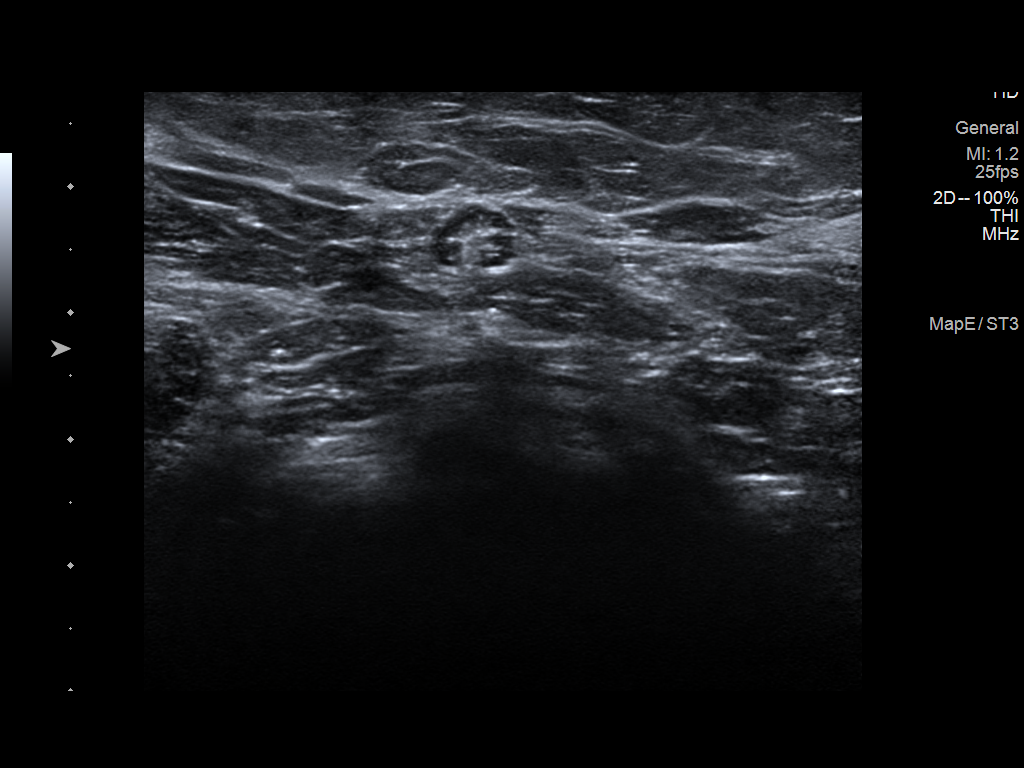
[im 2/4]
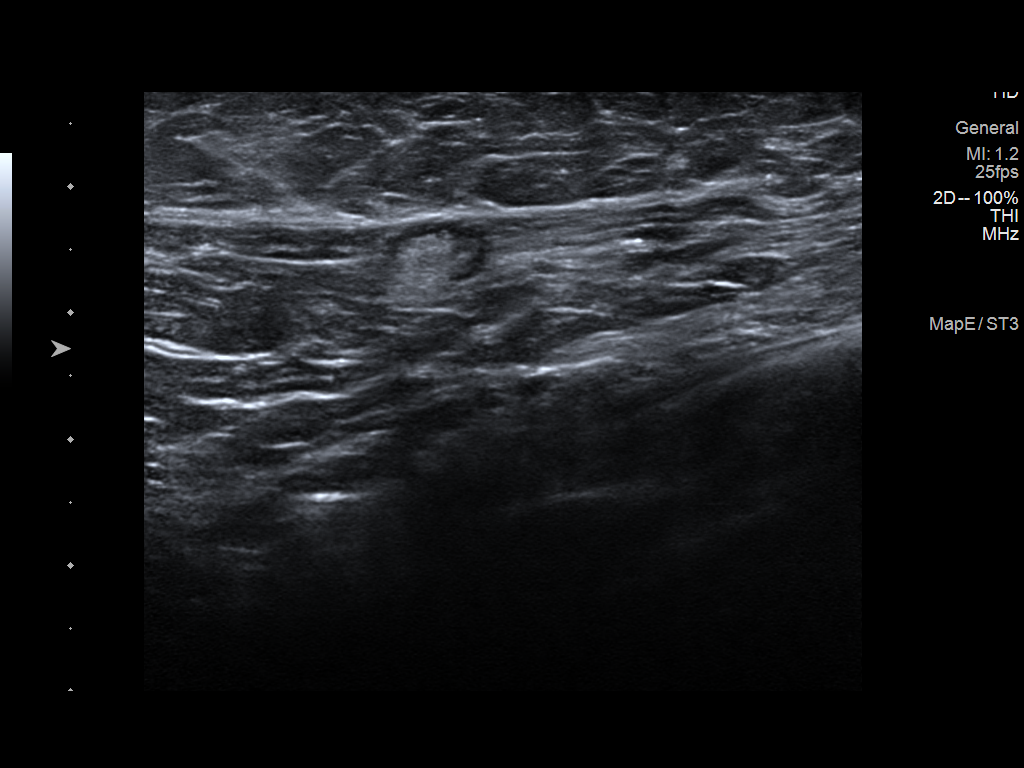
[im 3/4]
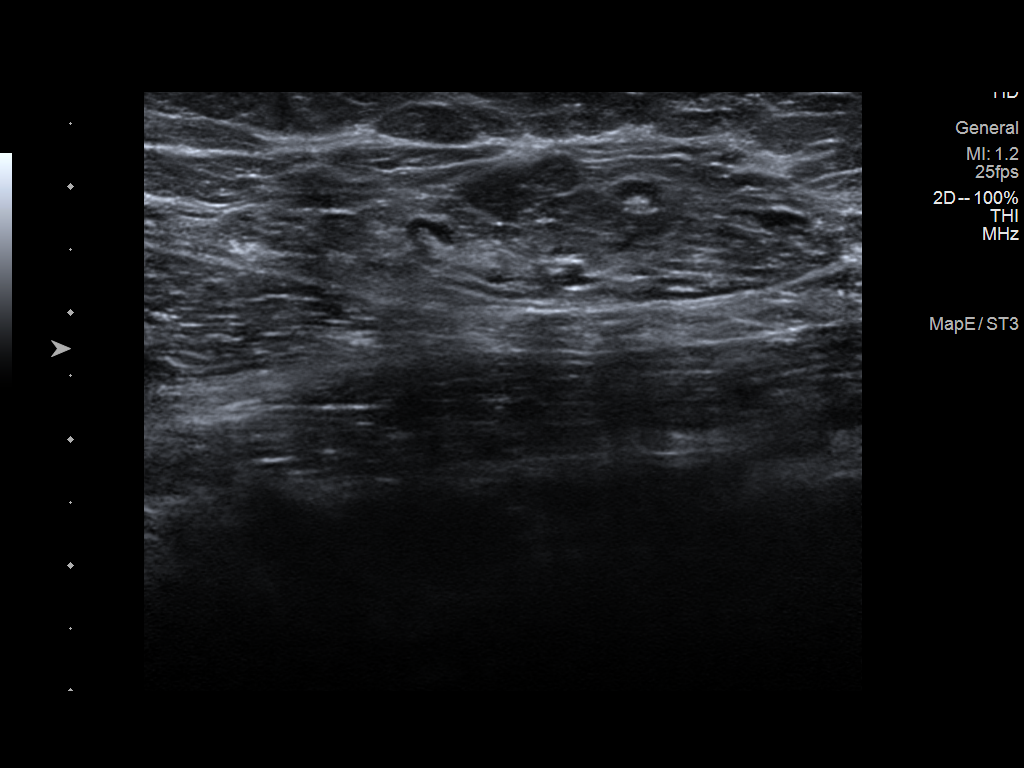
[im 4/4]
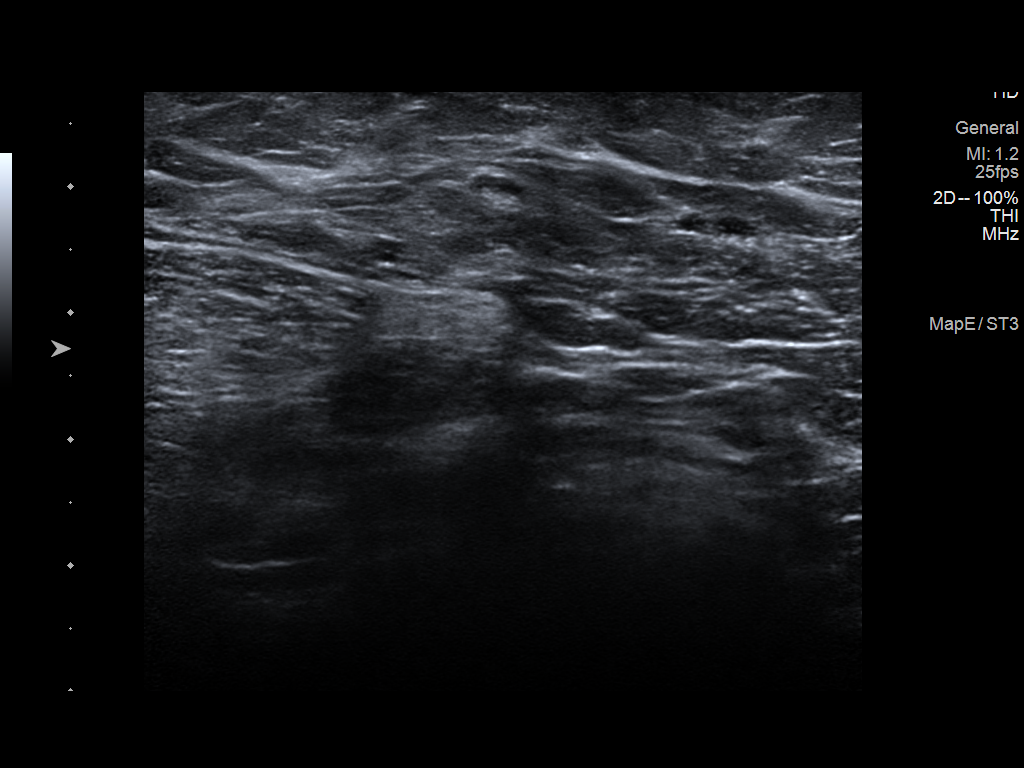

[4 of 4 positions shown; findings below may reference images not displayed]

ACR Breast Density Category c: The breast tissue is heterogeneously
dense, which may obscure small masses.
FINDINGS: No suspicious mass, suspicious microcalcification, or architectural
distortion is identified in either breast. Bilateral similar
appearing punctate calcifications in both breasts. Spot tangential
view the left axilla shows normal appearing left axillary lymph
nodes and normal appearing subcutaneous fat.

The imaged portion of the right axilla is negative.

Mammographic images were processed with CAD.

On physical exam, the left axilla is soft to palpation. No
lymphadenopathy is felt.

Targeted ultrasound is performed, showing normal axillary
subcutaneous fat and normal axillary lymph nodes. No
lymphadenopathy, mass, edema, or fluid collection. Skin thickness is
normal.
IMPRESSION: No evidence of malignancy in either breast. Normal appearance of the
left axilla.

RECOMMENDATION:
Screening mammogram in one year.(Code:E5-N-7Y6)

I have discussed the findings and recommendations with the patient.
If applicable, a reminder letter will be sent to the patient
regarding the next appointment.

BI-RADS CATEGORY  1: Negative.

## 2020-04-09 ENCOUNTER — Ambulatory Visit: Payer: Self-pay | Admitting: Obstetrics and Gynecology

## 2020-04-09 ENCOUNTER — Encounter: Payer: Self-pay | Admitting: Obstetrics and Gynecology

## 2020-04-09 ENCOUNTER — Telehealth: Payer: Self-pay

## 2020-04-09 NOTE — Progress Notes (Deleted)
44 y.o. [id number] Legally Separated Caucasian female here for annual exam.    PCP:     No LMP recorded. (Menstrual status: IUD).           Sexually active: {yes no:314532}  The current method of family planning is IUD--Mirena 11-26-16.    Exercising: {yes no:314532}  {types:19826} Smoker:  {YES NO:22349}  Health Maintenance: Pap: 04-04-19 Neg:Neg HR HPV, 01-02-18 Neg:Pos HR HPV,12/29/16 Neg:Pos HR HPV; Neg 58,85,02  History of abnormal Pap:  Yes, 01-02-18 Neg:Pos HR HPV with colposcopy revealing inflammation on ECC and cervical biopsy. 12/29/16 Neg:Pos HR HPV; Neg 77,41,28. 2005 Hx LEEP for cervical dysplasia. MMG: 04-12-19 Diag.Bil/Lt.US/Neg/density C/BiRads1 Colonoscopy:  *** BMD:   ***  Result  *** TDaP:  01-13-18 Gardasil:   no HIV:12-19-14 NR Hep C:12-19-14 Neg Screening Labs:  Hb today: ***, Urine today: ***   reports that she has been smoking cigarettes. She has a 3.75 pack-year smoking history. She has never used smokeless tobacco. She reports current alcohol use of about 6.0 standard drinks of alcohol per week. She reports that she does not use drugs.  Past Medical History:  Diagnosis Date  . Abnormal Pap smear of cervix 2005   Cervical dysplasia, s/p LEEP in [place], [place]  . Cigarette nicotine dependence without complication 04/07/2017  . History of loop electrical excision procedure (LEEP) 07/04/2019  . Obesity (BMI 30-39.9) 04/07/2017  . Residual hemorrhoidal skin tags 04/07/2017  . Situational anxiety 04/07/2017    Past Surgical History:  Procedure Laterality Date  . CERVICAL BIOPSY  W/ LOOP ELECTRODE EXCISION  2005   Cerivcal dysplagia [place], [place]  . INTRAUTERINE DEVICE INSERTION  11/26/2016   Mirena 11/2011 and 11/2016    Current Outpatient Medications  Medication Sig Dispense Refill  . clonazePAM (KLONOPIN) 0.5 MG tablet Take 1 tablet (0.5 mg total) by mouth 2 (two) times daily as needed for anxiety. 60 tablet 3  . levonorgestrel (MIRENA) 20 MCG/24HR IUD 1 each by Intrauterine  route once. Replaced around April or May 2019.    . sertraline (ZOLOFT) 50 MG tablet Take 1 tablet (50 mg total) by mouth daily. 90 tablet 3  . valACYclovir (VALTREX) 1000 MG tablet TAKE 2 TABLETS BY MOUTH TWICE A DAY FOR 1 DAY AT ONSET OF COLD SORE  3   No current facility-administered medications for this visit.    Family History  Problem Relation Age of Onset  . Diabetes type I Father   . Congenital heart disease Brother   . Alzheimer's disease Maternal Grandfather   . Dementia Maternal Grandmother   . Osteoarthritis Maternal Aunt   . Healthy Daughter   . Healthy Daughter     Review of Systems  Exam:   There were no vitals taken for this visit.    General appearance: alert, cooperative and appears stated age Head: normocephalic, without obvious abnormality, atraumatic Neck: no adenopathy, supple, symmetrical, trachea midline and thyroid normal to inspection and palpation Lungs: clear to auscultation bilaterally Breasts: normal appearance, no masses or tenderness, No nipple retraction or dimpling, No nipple discharge or bleeding, No axillary adenopathy Heart: regular rate and rhythm Abdomen: soft, non-tender; no masses, no organomegaly Extremities: extremities normal, atraumatic, no cyanosis or edema Skin: skin color, texture, turgor normal. No rashes or lesions Lymph nodes: cervical, supraclavicular, and axillary nodes normal. Neurologic: grossly normal  Pelvic: External genitalia:  no lesions              No abnormal inguinal nodes palpated.  Urethra:  normal appearing urethra with no masses, tenderness or lesions              Bartholins and Skenes: normal                 Vagina: normal appearing vagina with normal color and discharge, no lesions              Cervix: no lesions              Pap taken: {yes no:314532} Bimanual Exam:  Uterus:  normal size, contour, position, consistency, mobility, non-tender              Adnexa: no mass, fullness, tenderness               Rectal exam: {yes no:314532}.  Confirms.              Anus:  normal sphincter tone, no lesions  Chaperone was present for exam.  Assessment:   Well woman visit with normal exam.   Plan: Mammogram screening discussed. Self breast awareness reviewed. Pap and HR HPV as above. Guidelines for Calcium, Vitamin D, regular exercise program including cardiovascular and weight bearing exercise.   Follow up annually and prn.   Additional counseling given.  {yes [id number]. _______ minutes face to face time of which over 50% was spent in counseling.    After visit summary provided.

## 2020-04-09 NOTE — Telephone Encounter (Signed)
Patient left message to cancel AEX for today. Left message for patient to reschedule.

## 2020-04-09 NOTE — Telephone Encounter (Signed)
Thank you for the update!

## 2020-09-15 ENCOUNTER — Telehealth: Payer: Self-pay

## 2020-09-15 ENCOUNTER — Other Ambulatory Visit: Payer: Self-pay

## 2020-09-15 ENCOUNTER — Other Ambulatory Visit: Payer: Self-pay | Admitting: Family Medicine

## 2020-09-15 MED ORDER — SERTRALINE HCL 50 MG PO TABS: 50.0000 mg | ORAL_TABLET | Freq: Every day | ORAL | 0 refills | Status: DC

## 2020-09-15 NOTE — Telephone Encounter (Signed)
I will refill monthly until appt, but appt should be CPE not med follow up

## 2020-09-15 NOTE — Telephone Encounter (Signed)
.   LAST APPOINTMENT DATE: 12/24/19   NEXT APPOINTMENT DATE:@5 /08/2020  MEDICATION:sertraline (ZOLOFT) 50 MG tablet   PHARMACY:[id number] - [place],  - [address]    CLINICAL FILLS OUT ALL BELOW:   LAST REFILL:  QTY:  REFILL DATE:    OTHER COMMENTS:    Okay for refill?  Please advise

## 2020-09-16 NOTE — Telephone Encounter (Signed)
Scheduled CPE 

## 2020-09-22 ENCOUNTER — Telehealth: Payer: Self-pay

## 2020-09-22 NOTE — Telephone Encounter (Signed)
sertraline (ZOLOFT) 50 MG tablet  Pt [facility] her they do not have the medication above for her

## 2020-09-24 ENCOUNTER — Other Ambulatory Visit: Payer: Self-pay

## 2020-09-24 MED ORDER — SERTRALINE HCL 50 MG PO TABS: 50.0000 mg | ORAL_TABLET | Freq: Every day | ORAL | 0 refills | Status: DC

## 2020-09-24 NOTE — Telephone Encounter (Signed)
Medication resent to pharmacy  

## 2020-11-13 ENCOUNTER — Other Ambulatory Visit: Payer: Self-pay | Admitting: Family Medicine

## 2020-11-17 ENCOUNTER — Encounter: Payer: Managed Care, Other (non HMO) | Admitting: Family Medicine

## 2020-11-17 ENCOUNTER — Other Ambulatory Visit: Payer: Self-pay | Admitting: Family Medicine

## 2020-12-11 ENCOUNTER — Encounter: Payer: Managed Care, Other (non HMO) | Admitting: Family Medicine

## 2020-12-17 ENCOUNTER — Telehealth: Payer: Managed Care, Other (non HMO) | Admitting: Family Medicine

## 2020-12-19 ENCOUNTER — Ambulatory Visit (INDEPENDENT_AMBULATORY_CARE_PROVIDER_SITE_OTHER): Payer: 59 | Admitting: Family Medicine

## 2020-12-19 ENCOUNTER — Other Ambulatory Visit: Payer: Self-pay

## 2020-12-19 ENCOUNTER — Encounter: Payer: Self-pay | Admitting: Family Medicine

## 2020-12-19 VITALS — BP 140/98 | HR 78 | Temp 98.3°F | Ht 64.0 in | Wt 177.6 lb

## 2020-12-19 DIAGNOSIS — Z0000 Encounter for general adult medical examination without abnormal findings: Secondary | ICD-10-CM

## 2020-12-19 DIAGNOSIS — Z9889 Other specified postprocedural states: Secondary | ICD-10-CM

## 2020-12-19 DIAGNOSIS — F418 Other specified anxiety disorders: Secondary | ICD-10-CM

## 2020-12-19 DIAGNOSIS — F17210 Nicotine dependence, cigarettes, uncomplicated: Secondary | ICD-10-CM | POA: Diagnosis not present

## 2020-12-19 DIAGNOSIS — Z975 Presence of (intrauterine) contraceptive device: Secondary | ICD-10-CM | POA: Diagnosis not present

## 2020-12-19 DIAGNOSIS — Z8742 Personal history of other diseases of the female genital tract: Secondary | ICD-10-CM | POA: Diagnosis not present

## 2020-12-19 DIAGNOSIS — F411 Generalized anxiety disorder: Secondary | ICD-10-CM | POA: Diagnosis not present

## 2020-12-19 DIAGNOSIS — R030 Elevated blood-pressure reading, without diagnosis of hypertension: Secondary | ICD-10-CM

## 2020-12-19 LAB — CBC WITH DIFFERENTIAL/PLATELET
Basophils Absolute: 0.1 10*3/uL (ref 0.0–0.1)
Basophils Relative: 1 % (ref 0.0–3.0)
Eosinophils Absolute: 0.2 10*3/uL (ref 0.0–0.7)
Eosinophils Relative: 2.5 % (ref 0.0–5.0)
HCT: 38.6 % (ref 36.0–46.0)
Hemoglobin: 13.3 g/dL (ref 12.0–15.0)
Lymphocytes Relative: 23.9 % (ref 12.0–46.0)
Lymphs Abs: 2 10*3/uL (ref 0.7–4.0)
MCHC: 34.5 g/dL (ref 30.0–36.0)
MCV: 100.7 fl — ABNORMAL HIGH (ref 78.0–100.0)
Monocytes Absolute: 0.4 10*3/uL (ref 0.1–1.0)
Monocytes Relative: 4.8 % (ref 3.0–12.0)
Neutro Abs: 5.7 10*3/uL (ref 1.4–7.7)
Neutrophils Relative %: 67.8 % (ref 43.0–77.0)
Platelets: 332 10*3/uL (ref 150.0–400.0)
RBC: 3.83 Mil/uL — ABNORMAL LOW (ref 3.87–5.11)
RDW: 13 % (ref 11.5–15.5)
WBC: 8.4 10*3/uL (ref 4.0–10.5)

## 2020-12-19 LAB — TSH: TSH: 2.4 u[IU]/mL (ref 0.35–4.50)

## 2020-12-19 MED ORDER — SERTRALINE HCL 50 MG PO TABS: 100.0000 mg | ORAL_TABLET | Freq: Every day | ORAL | 1 refills | Status: DC

## 2020-12-19 MED ORDER — CLONAZEPAM 0.5 MG PO TABS: 0.5000 mg | ORAL_TABLET | Freq: Every day | ORAL | 0 refills | Status: AC | PRN

## 2020-12-19 NOTE — Patient Instructions (Signed)
Please return in 6 weeks to recheck mood.  Please schedule with Dr. Edward Jolly for your annual GYN exam and pap smear which is due.   I will release your lab results to you on your MyChart account with further instructions. Please reply with any questions.   Please increase your zoloft to 100mg  daily.   If you have any questions or concerns, please don't hesitate to send me a message via MyChart or call the office at [phone]. Thank you for visiting with [phone] today! It's our pleasure caring for you.

## 2020-12-19 NOTE — Progress Notes (Signed)
Subjective  Chief Complaint  Patient presents with  . Annual Exam  . Anxiety    Zoloft refills    HPI: Lauren Branch is a 45 y.o. female who presents to [facility] [place] today for a Female Wellness Visit.  She also has the concerns and/or needs as listed above in the chief complaint. These will be addressed in addition to the Health Maintenance Visit.   Wellness Visit: annual visit with health maintenance review and exam without Pap   Health maintenance: Patient is due for Pap smear.  She will get scheduled with her GYN.  She is no longer working for [employer].  She is now working for the [employer] in [employer].  This has been a good transition for her.  Last stress.  She is happier.  No immunizations are due.  Lifestyle is healthy.  Has an IUD.  Has history of CIN-1 and LEEP procedure due for annual recheck. Chronic disease management visit and/or acute problem visit:  Depression and anxiety: Last year we started Zoloft.  She takes 50 mg daily.  She has had a good response.  Further history reveals that she tends to still run slightly anxious but overall manages well.  She uses Klonopin rarely but admits she tends to use them less frequently than she feels she needs them.  She does see a Veterinary surgeoncounselor.  No adverse effects from her Zoloft.  She is a smoker.  Remains precontemplative in quitting.  Blood pressure is elevated in the office today.  No history of hypertension.  [facility] screen [facility] 2/9 12/19/2020 07/04/2019 07/17/2018 01/13/2018 07/15/2017  Decreased Interest 0 1 0 0 0  Down, Depressed, Hopeless 0 0 0 0 0  PHQ - 2 Score 0 1 0 0 0  Altered sleeping 0 1 0 0 1  Tired, decreased energy 0 1 0 0 1  Change in appetite 0 1 0 0 0  Feeling bad or failure about yourself  0 1 0 0 1  Trouble concentrating 0 0 0 0 0  Moving slowly or fidgety/restless 0 0 0 0 0  Suicidal thoughts 0 0 0 0 0  PHQ-9 Score 0 5 0 0 3  Difficult doing work/chores Not difficult  at all Somewhat difficult - Not difficult at all Not difficult at all   GAD 7 : Generalized Anxiety Score 12/19/2020 12/19/2020 12/24/2019 07/04/2019  Nervous, Anxious, on Edge 2 - 1 2  Control/stop worrying 2 1 0 2  Worry too much - different things 2 2 1 2   Trouble relaxing 1 0 0 1  Restless 1 0 0 1  Easily annoyed or irritable 0 0 0 1  Afraid - awful might happen 0 0 0 0  Total GAD 7 Score 8 - 2 9  Anxiety Difficulty Not difficult at all Not difficult at all - Somewhat difficult     Assessment  1. Annual physical exam   2. GAD (generalized anxiety disorder)   3. Cigarette nicotine dependence, precontemplative   4. IUD (intrauterine device) in place   5. History of abnormal cervical Pap smear w/ neg colpo 2019    6. History of loop electrical excision procedure (LEEP)   7. Situational anxiety      Plan  Female Wellness Visit:  Age appropriate Health Maintenance and Prevention measures were discussed with patient. Included topics are cancer screening recommendations, ways to keep healthy (see AVS) including dietary and exercise recommendations, regular eye and dental care, use of  seat belts, and avoidance of moderate alcohol use and tobacco use.  Recommend cessation of smoking.  Patient will begin to think about it  BMI: discussed patient's BMI and encouraged positive lifestyle modifications to help get to or maintain a target BMI.  HM needs and immunizations were addressed and ordered. See below for orders. See HM and immunization section for updates.  Routine labs and screening tests ordered including cmp, cbc and lipids where appropriate.  Discussed recommendations regarding Vit D and calcium supplementation (see AVS)  Chronic disease f/u and/or acute problem visit: (deemed necessary to be done in addition to the wellness visit):  Cervical cancer screening due: Patient to get scheduled with her GYN  Elevated blood pressure without history of hypertension.  Elevated blood  pressure.  We will recheck at follow-up  Depression and stress reaction: Overall much improved.  Counseling done.  Recommend increasing Zoloft to 100 mg daily.  Recheck 6 weeks.  Klonopin refilled for rare use if needed.  Smoker, precontemplative about quitting.  Counseling given  Follow up: 6 weeks to recheck mood  Orders Placed This Encounter  Procedures  . CBC with Differential/Platelet  . Comprehensive metabolic panel  . Lipid panel  . TSH   Meds ordered this encounter  Medications  . DISCONTD: sertraline (ZOLOFT) 50 MG tablet    Sig: Take 2 tablets (100 mg total) by mouth daily.    Dispense:  60 tablet    Refill:  1  . clonazePAM (KLONOPIN) 0.5 MG tablet    Sig: Take 1 tablet (0.5 mg total) by mouth daily as needed for anxiety.    Dispense:  20 tablet    Refill:  0       Body mass index is 30.48 kg/m. Wt Readings from Last 3 Encounters:  12/19/20 177 lb 9.6 oz (80.6 kg)  08/17/19 168 lb 6.4 oz (76.4 kg)  07/04/19 171 lb 4.8 oz (77.7 kg)   Need for contraception: Yes, IUD  Patient Active Problem List   Diagnosis Date Noted  . History of loop electrical excision procedure (LEEP) 07/04/2019  . History of abnormal cervical Pap smear w/ neg colpo 2019  01/13/2018  . GAD (generalized anxiety disorder) 04/07/2017  . Residual hemorrhoidal skin tags 04/07/2017  . Cigarette nicotine dependence, precontemplative 04/07/2017  . IUD (intrauterine device) in place 04/07/2017   Health Maintenance  Topic Date Due  . COVID-19 Vaccine (1) Never done  . PAP SMEAR-Modifier  04/03/2020  . INFLUENZA VACCINE  02/16/2021  . Zoster Vaccines- Shingrix (1 of 2) 07/16/2027  . TETANUS/TDAP  01/14/2028  . Hepatitis C Screening  Completed  . HIV Screening  Completed  . HPV VACCINES  Aged Out  . Pneumococcal Vaccine 57-28 Years old  Discontinued   Immunization History  Administered Date(s) Administered  . Influenza Inj Mdck Quad With Preservative 04/17/2018  . Influenza,inj,Quad  PF,6+ Mos 05/15/2017, 08/17/2019  . Tdap 01/13/2018   We updated and reviewed the patient's past history in detail and it is documented below. Allergies: Patient  reports current alcohol use of about 6.0 standard drinks of alcohol per week. Past Medical History Patient  has a past medical history of Abnormal Pap smear of cervix (2005), Cigarette nicotine dependence without complication (04/07/2017), History of loop electrical excision procedure (LEEP) (07/04/2019), Obesity (BMI 30-39.9) (04/07/2017), Residual hemorrhoidal skin tags (04/07/2017), and Situational anxiety (04/07/2017). Past Surgical History Patient  has a past surgical history that includes Cervical biopsy w/ loop electrode excision (2005) and Intrauterine device insertion (11/26/2016).  Social History   Socioeconomic History  . Marital status: Legally Separated    Spouse name: Not on file  . Number of children: 2  . Years of education: Not on file  . Highest education level: Not on file  Occupational History  . Occupation: Geophysicist/field seismologist: [employer]  Tobacco Use  . Smoking status: Light Tobacco Smoker    Packs/day: 0.20    Years: 15.00    Pack years: 3.00    Types: Cigarettes  . Smokeless tobacco: Never Used  . Tobacco comment: smokes 3 cigarettes/day  Vaping Use  . Vaping Use: Every day  . Substances: Nicotine  . Devices: jule  Substance and Sexual Activity  . Alcohol use: Yes    Alcohol/week: 6.0 standard drinks    Types: 6 Standard drinks or equivalent per week    Comment: socially  . Drug use: No  . Sexual activity: Not Currently    Partners: Male    Birth control/protection: I.U.D.  Other Topics Concern  . Not on file  Social History Narrative  . Not on file   Social Determinants of Health   Financial Resource Strain: Not on file  Food Insecurity: Not on file  Transportation Needs: Not on file  Physical Activity: Not on file  Stress: Not on file  Social Connections: Not on file    Family History  Problem Relation Age of Onset  . Diabetes type I Father   . Congenital heart disease Brother   . Alzheimer's disease Maternal Grandfather   . Dementia Maternal Grandmother   . Osteoarthritis Maternal Aunt   . Healthy Daughter   . Healthy Daughter     Review of Systems: Constitutional: negative for fever or malaise Ophthalmic: negative for photophobia, double vision or loss of vision Cardiovascular: negative for chest pain, dyspnea on exertion, or new LE swelling Respiratory: negative for SOB or persistent cough Gastrointestinal: negative for abdominal pain, change in bowel habits or melena Genitourinary: negative for dysuria or gross hematuria, no abnormal uterine bleeding or disharge Musculoskeletal: negative for new gait disturbance or muscular weakness Integumentary: negative for new or persistent rashes, no breast lumps Neurological: negative for TIA or stroke symptoms Psychiatric: negative for SI or delusions Allergic/Immunologic: negative for hives  Patient Care Team    Relationship Specialty Notifications Start End  Willow Ora, MD PCP - General Family Medicine  07/04/19   Floria Raveling Consulting Physician Dentistry  03/18/17   Patton Salles, MD Consulting Physician Obstetrics and Gynecology  01/13/18     Objective  Vitals: BP (!) 140/98   Pulse 78   Temp 98.3 F (36.8 C) (Temporal)   Ht 5\' 4"  (1.626 m)   Wt 177 lb 9.6 oz (80.6 kg)   SpO2 97%   BMI 30.48 kg/m  General:  Well developed, well nourished, no acute distress  Psych:  Alert and orientedx3,normal mood and affect HEENT:  Normocephalic, atraumatic, non-icteric sclera, PERRL, supple neck without adenopathy, mass or thyromegaly Cardiovascular:  Normal S1, S2, RRR without gallop, rub or murmur Respiratory:  Good breath sounds bilaterally, CTAB with normal respiratory effort Gastrointestinal: normal bowel sounds, soft, non-tender, no noted masses. No HSM MSK: no  deformities, contusions. Joints are without erythema or swelling.  Skin:  Warm, no rashes or suspicious lesions noted Neurologic:    Mental status is normal. Gross motor and sensory exams are normal. Normal gait. No tremor     Commons side effects, risks, benefits, and alternatives for  medications and treatment plan prescribed today were discussed, and the patient expressed understanding of the given instructions. Patient is instructed to call or message via MyChart if he/she has any questions or concerns regarding our treatment plan. No barriers to understanding were identified. We discussed Red Flag symptoms and signs in detail. Patient expressed understanding regarding what to do in case of urgent or emergency type symptoms.   Medication list was reconciled, printed and provided to the patient in AVS. Patient instructions and summary information was reviewed with the patient as documented in the AVS. This note was prepared with assistance of Dragon voice recognition software. Occasional wrong-word or sound-a-like substitutions may have occurred due to the inherent limitations of voice recognition software  This visit occurred during the SARS-CoV-2 public health emergency.  Safety protocols were in place, including screening questions prior to the visit, additional usage of staff PPE, and extensive cleaning of exam room while observing appropriate contact time as indicated for disinfecting solutions.

## 2020-12-22 ENCOUNTER — Other Ambulatory Visit: Payer: Self-pay | Admitting: Family Medicine

## 2020-12-22 LAB — COMPREHENSIVE METABOLIC PANEL
ALT: 25 U/L (ref 0–35)
AST: 35 U/L (ref 0–37)
Albumin: 4.3 g/dL (ref 3.5–5.2)
Alkaline Phosphatase: 70 U/L (ref 39–117)
BUN: 7 mg/dL (ref 6–23)
CO2: 20 mEq/L (ref 19–32)
Calcium: 9 mg/dL (ref 8.4–10.5)
Chloride: 99 mEq/L (ref 96–112)
Creatinine, Ser: 0.66 mg/dL (ref 0.40–1.20)
GFR: 107.43 mL/min (ref >60.00)
Glucose, Bld: 94 mg/dL (ref 70–99)
Potassium: 3.9 mEq/L (ref 3.5–5.1)
Sodium: 139 mEq/L (ref 135–145)
Total Bilirubin: 0.5 mg/dL (ref 0.2–1.2)
Total Protein: 7.2 g/dL (ref 6.0–8.3)

## 2020-12-22 LAB — LIPID PANEL
Cholesterol: 213 mg/dL — ABNORMAL HIGH (ref 0–200)
HDL: 66.8 mg/dL (ref >39.00)
LDL Cholesterol: 118 mg/dL — ABNORMAL HIGH (ref 0–99)
NonHDL: 146.44
Total CHOL/HDL Ratio: 3
Triglycerides: 140 mg/dL (ref 0.0–149.0)
VLDL: 28 mg/dL (ref 0.0–40.0)

## 2020-12-23 ENCOUNTER — Encounter: Payer: Self-pay | Admitting: Family Medicine

## 2020-12-23 NOTE — Progress Notes (Signed)
Lab results mailed to patient in letter. Normal results. No action / follow up needed on these results.  

## 2021-02-09 ENCOUNTER — Ambulatory Visit (INDEPENDENT_AMBULATORY_CARE_PROVIDER_SITE_OTHER): Payer: 59 | Admitting: Family Medicine

## 2021-02-09 ENCOUNTER — Other Ambulatory Visit: Payer: Self-pay

## 2021-02-09 VITALS — BP 120/72 | HR 81 | Temp 98.0°F | Resp 17 | Wt 177.6 lb

## 2021-02-09 DIAGNOSIS — F411 Generalized anxiety disorder: Secondary | ICD-10-CM | POA: Diagnosis not present

## 2021-02-09 MED ORDER — VALACYCLOVIR HCL 1 G PO TABS: ORAL_TABLET | ORAL | 3 refills | Status: AC

## 2021-02-09 MED ORDER — SERTRALINE HCL 100 MG PO TABS: 100.0000 mg | ORAL_TABLET | Freq: Every day | ORAL | 3 refills | Status: DC

## 2021-02-09 NOTE — Patient Instructions (Signed)
Please return in June 2023 for your annual complete physical; please come fasting.  Sooner if needed for mood recheck.   Take care!  If you have any questions or concerns, please don't hesitate to send me a message via MyChart or call the office at [phone]. Thank you for visiting with [place] today! It's our pleasure caring for you.

## 2021-02-09 NOTE — Progress Notes (Signed)
Subjective  CC:  Chief Complaint  Patient presents with   Follow-up    6 week recheck mood    HPI: Lauren Branch is a 45 y.o. female who presents to the office today to address the problems listed above in the chief complaint, mood problems. GAD and mood f/u. We increased zoloft dose to 100mg  about 6 weeks ago better manage her active anxiety sxs. She has had a good response; feels calmer, less irritability, less worry. No Aes. Stable mood. Rare need for klonopin now. Occ flushing with anxiety sxs but no panic attacks.  Depression screen [facility] 2/9 02/09/2021 12/19/2020 07/04/2019  Decreased Interest 0 0 1  Down, Depressed, Hopeless 0 0 0  PHQ - 2 Score 0 0 1  Altered sleeping 0 0 1  Tired, decreased energy 0 0 1  Change in appetite 0 0 1  Feeling bad or failure about yourself  0 0 1  Trouble concentrating 0 0 0  Moving slowly or fidgety/restless 0 0 0  Suicidal thoughts 0 0 0  PHQ-9 Score 0 0 5  Difficult doing work/chores Not difficult at all Not difficult at all Somewhat difficult   GAD 7 : Generalized Anxiety Score 12/19/2020 12/19/2020 12/24/2019 07/04/2019  Nervous, Anxious, on Edge 2 - 1 2  Control/stop worrying 2 1 0 2  Worry too much - different things 2 2 1 2   Trouble relaxing 1 0 0 1  Restless 1 0 0 1  Easily annoyed or irritable 0 0 0 1  Afraid - awful might happen 0 0 0 0  Total GAD 7 Score 8 - 2 9  Anxiety Difficulty Not difficult at all Not difficult at all - Somewhat difficult     Assessment  1. GAD (generalized anxiety disorder)      Plan  GAD:  well controlled on zoloft 100mg  daily. Recheck 6-12 months. Counseling done.  Reviewed concept of mood problems caused by biochemical imbalance of neurotransmitters and rationale for treatment with medications and therapy.  Counseling given: pt was instructed to contact office, on-call physician or crisis Hotline if symptoms worsen significantly. If patient develops any suicidal or homicidal thoughts, she is directed to  the ER immediately.   Follow up: June 2023 for cpe  No orders of the defined types were placed in this encounter.  No orders of the defined types were placed in this encounter.     I reviewed the patients updated PMH, FH, and SocHx.    Patient Active Problem List   Diagnosis Date Noted   History of loop electrical excision procedure (LEEP) 07/04/2019   History of abnormal cervical Pap smear w/ neg colpo 2019  01/13/2018   GAD (generalized anxiety disorder) 04/07/2017   Residual hemorrhoidal skin tags 04/07/2017   Cigarette nicotine dependence, precontemplative 04/07/2017   IUD (intrauterine device) in place 04/07/2017   Current Meds  Medication Sig   levonorgestrel (MIRENA) 20 MCG/24HR IUD 1 each by Intrauterine route once. Replaced around April or May 2019.   sertraline (ZOLOFT) 50 MG tablet TAKE 1 TABLET BY MOUTH EVERY DAY (Patient taking differently: Patient taking 2 daily)   valACYclovir (VALTREX) 1000 MG tablet TAKE 2 TABLETS BY MOUTH TWICE A DAY FOR 1 DAY AT ONSET OF COLD SORE    Allergies: Patient has No Known Allergies. Family history:  Patient family history includes Alzheimer's disease in her maternal grandfather; Congenital heart disease in her brother; Dementia in her maternal grandmother; Diabetes type I in her father; Healthy  in her daughter and daughter; Osteoarthritis in her maternal aunt. Social History   Socioeconomic History   Marital status: Legally Separated    Spouse name: Not on file   Number of children: 2   Years of education: Not on file   Highest education level: Not on file  Occupational History   Occupation: payroll    Employer: [employer]  Tobacco Use   Smoking status: Light Smoker    Packs/day: 0.20    Years: 15.00    Pack years: 3.00    Types: Cigarettes   Smokeless tobacco: Never   Tobacco comments:    smokes 3 cigarettes/day  Vaping Use   Vaping Use: Every day   Substances: Nicotine   Devices: jule  Substance and Sexual  Activity   Alcohol use: Yes    Alcohol/week: 6.0 standard drinks    Types: 6 Standard drinks or equivalent per week    Comment: socially   Drug use: No   Sexual activity: Not Currently    Partners: Male    Birth control/protection: I.U.D.  Other Topics Concern   Not on file  Social History Narrative   Not on file   Social Determinants of Health   Financial Resource Strain: Not on file  Food Insecurity: Not on file  Transportation Needs: Not on file  Physical Activity: Not on file  Stress: Not on file  Social Connections: Not on file     Review of Systems: Constitutional: Negative for fever malaise or anorexia Cardiovascular: negative for chest pain Respiratory: negative for SOB or persistent cough Gastrointestinal: negative for abdominal pain  Objective  Vitals: BP 120/72   Pulse 81   Temp 98 F (36.7 C) (Temporal)   Resp 17   Wt 177 lb 9.6 oz (80.6 kg)   SpO2 96%   BMI 30.48 kg/m  General: no acute distress, well appearing, no apparent distress, well groomed Psych:  Alert and oriented x 3,normal mood, behavior, speech, dress, and thought processes.    Commons side effects, risks, benefits, and alternatives for medications and treatment plan prescribed today were discussed, and the patient expressed understanding of the given instructions. Patient is instructed to call or message via MyChart if he/she has any questions or concerns regarding our treatment plan. No barriers to understanding were identified. We discussed Red Flag symptoms and signs in detail. Patient expressed understanding regarding what to do in case of urgent or emergency type symptoms.  Medication list was reconciled, printed and provided to the patient in AVS. Patient instructions and summary information was reviewed with the patient as documented in the AVS. This note was prepared with assistance of Dragon voice recognition software. Occasional wrong-word or sound-a-like substitutions may have  occurred due to the inherent limitations of voice recognition software

## 2021-02-25 ENCOUNTER — Other Ambulatory Visit: Payer: Self-pay | Admitting: Family Medicine

## 2021-12-21 ENCOUNTER — Encounter: Payer: 59 | Admitting: Family Medicine

## 2022-03-06 ENCOUNTER — Other Ambulatory Visit: Payer: Self-pay | Admitting: Family Medicine

## 2024-07-06 ENCOUNTER — Other Ambulatory Visit: Payer: Self-pay

## 2024-07-06 DIAGNOSIS — Z1231 Encounter for screening mammogram for malignant neoplasm of breast: Secondary | ICD-10-CM

## 2024-07-13 ENCOUNTER — Ambulatory Visit
Admission: RE | Admit: 2024-07-13 | Discharge: 2024-07-13 | Disposition: A | Discharge status: Home / Self Care | Source: Ambulatory Visit

## 2024-07-13 DIAGNOSIS — Z1231 Encounter for screening mammogram for malignant neoplasm of breast: Secondary | ICD-10-CM

## 2024-07-20 ENCOUNTER — Other Ambulatory Visit: Payer: Self-pay

## 2024-07-20 DIAGNOSIS — R928 Other abnormal and inconclusive findings on diagnostic imaging of breast: Secondary | ICD-10-CM

## 2024-07-25 ENCOUNTER — Ambulatory Visit
Admission: RE | Admit: 2024-07-25 | Discharge: 2024-07-25 | Disposition: A | Discharge status: Home / Self Care | Source: Ambulatory Visit

## 2024-07-25 ENCOUNTER — Other Ambulatory Visit: Payer: Self-pay

## 2024-07-25 DIAGNOSIS — R928 Other abnormal and inconclusive findings on diagnostic imaging of breast: Secondary | ICD-10-CM

## 2024-07-25 DIAGNOSIS — R921 Mammographic calcification found on diagnostic imaging of breast: Secondary | ICD-10-CM

## 2024-08-08 ENCOUNTER — Encounter

## 2024-08-20 ENCOUNTER — Encounter

## 2024-08-23 ENCOUNTER — Ambulatory Visit: Admission: RE | Admit: 2024-08-23 | Discharge: 2024-08-23 | Disposition: A | Source: Ambulatory Visit

## 2024-08-23 ENCOUNTER — Inpatient Hospital Stay: Admission: RE | Admit: 2024-08-23 | Discharge: 2024-08-23

## 2024-08-23 DIAGNOSIS — R921 Mammographic calcification found on diagnostic imaging of breast: Secondary | ICD-10-CM

## 2024-08-24 LAB — SURGICAL PATHOLOGY

## 2024-09-10 ENCOUNTER — Ambulatory Visit (HOSPITAL_COMMUNITY): Payer: Self-pay | Admitting: Family
